# Patient Record
Sex: Male | Born: 1955 | Race: Black or African American | Hispanic: No | Marital: Single | Smoking: Former smoker
Health system: Southern US, Community
[De-identification: ages and names within clinical notes are randomized; demographics above are authoritative.]

## PROBLEM LIST (undated history)

## (undated) DIAGNOSIS — Z800 Family history of malignant neoplasm of digestive organs: Secondary | ICD-10-CM

## (undated) DIAGNOSIS — Z5189 Encounter for other specified aftercare: Secondary | ICD-10-CM

## (undated) DIAGNOSIS — Z860100 Personal history of colon polyps, unspecified: Secondary | ICD-10-CM

## (undated) DIAGNOSIS — F329 Major depressive disorder, single episode, unspecified: Secondary | ICD-10-CM

## (undated) DIAGNOSIS — F1910 Other psychoactive substance abuse, uncomplicated: Secondary | ICD-10-CM

## (undated) DIAGNOSIS — L853 Xerosis cutis: Secondary | ICD-10-CM

## (undated) DIAGNOSIS — R739 Hyperglycemia, unspecified: Secondary | ICD-10-CM

## (undated) DIAGNOSIS — B20 Human immunodeficiency virus [HIV] disease: Secondary | ICD-10-CM

## (undated) DIAGNOSIS — F32A Depression, unspecified: Secondary | ICD-10-CM

## (undated) DIAGNOSIS — Z21 Asymptomatic human immunodeficiency virus [HIV] infection status: Secondary | ICD-10-CM

## (undated) DIAGNOSIS — R636 Underweight: Secondary | ICD-10-CM

## (undated) HISTORY — DX: Other psychoactive substance abuse, uncomplicated: F19.10

## (undated) HISTORY — DX: Depression, unspecified: F32.A

## (undated) HISTORY — PX: POLYPECTOMY: SHX149

## (undated) HISTORY — DX: Personal history of colon polyps, unspecified: Z86.0100

## (undated) HISTORY — PX: COLONOSCOPY: SHX174

## (undated) HISTORY — PX: TRUNK WOUND REPAIR / CLOSURE: SUR1144

## (undated) HISTORY — DX: Personal history of colonic polyps: Z86.010

## (undated) HISTORY — DX: Human immunodeficiency virus (HIV) disease: B20

## (undated) HISTORY — DX: Encounter for other specified aftercare: Z51.89

## (undated) HISTORY — DX: Asymptomatic human immunodeficiency virus (hiv) infection status: Z21

## (undated) HISTORY — DX: Family history of malignant neoplasm of digestive organs: Z80.0

---
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
## 1978-08-05 DIAGNOSIS — Z5189 Encounter for other specified aftercare: Secondary | ICD-10-CM

## 1978-08-05 HISTORY — DX: Encounter for other specified aftercare: Z51.89

## 1997-11-22 ENCOUNTER — Encounter: Admission: RE | Admit: 1997-11-22 | Discharge: 1997-11-22 | Payer: Self-pay | Admitting: Hematology and Oncology

## 1998-01-23 ENCOUNTER — Encounter: Admission: RE | Admit: 1998-01-23 | Discharge: 1998-01-23 | Payer: Self-pay | Admitting: Internal Medicine

## 1998-02-23 ENCOUNTER — Encounter: Admission: RE | Admit: 1998-02-23 | Discharge: 1998-02-23 | Payer: Self-pay | Admitting: Hematology and Oncology

## 1998-03-30 ENCOUNTER — Encounter: Admission: RE | Admit: 1998-03-30 | Discharge: 1998-03-30 | Payer: Self-pay | Admitting: Internal Medicine

## 1998-04-06 ENCOUNTER — Encounter: Admission: RE | Admit: 1998-04-06 | Discharge: 1998-04-06 | Payer: Self-pay | Admitting: Internal Medicine

## 1998-07-13 ENCOUNTER — Encounter: Admission: RE | Admit: 1998-07-13 | Discharge: 1998-07-13 | Payer: Self-pay | Admitting: Hematology and Oncology

## 1999-01-11 ENCOUNTER — Encounter: Admission: RE | Admit: 1999-01-11 | Discharge: 1999-01-11 | Payer: Self-pay | Admitting: Hematology and Oncology

## 1999-01-11 ENCOUNTER — Ambulatory Visit (HOSPITAL_COMMUNITY): Admission: RE | Admit: 1999-01-11 | Discharge: 1999-01-11 | Payer: Self-pay | Admitting: Hematology and Oncology

## 1999-03-12 ENCOUNTER — Encounter: Admission: RE | Admit: 1999-03-12 | Discharge: 1999-03-12 | Payer: Self-pay | Admitting: Internal Medicine

## 1999-06-11 ENCOUNTER — Encounter: Admission: RE | Admit: 1999-06-11 | Discharge: 1999-06-11 | Payer: Self-pay | Admitting: Hematology and Oncology

## 1999-06-11 ENCOUNTER — Ambulatory Visit (HOSPITAL_COMMUNITY): Admission: RE | Admit: 1999-06-11 | Discharge: 1999-06-11 | Payer: Self-pay | Admitting: Hematology and Oncology

## 1999-11-26 ENCOUNTER — Encounter: Admission: RE | Admit: 1999-11-26 | Discharge: 1999-11-26 | Payer: Self-pay | Admitting: Internal Medicine

## 1999-11-26 ENCOUNTER — Ambulatory Visit (HOSPITAL_COMMUNITY): Admission: RE | Admit: 1999-11-26 | Discharge: 1999-11-26 | Payer: Self-pay | Admitting: Internal Medicine

## 2000-06-02 ENCOUNTER — Ambulatory Visit (HOSPITAL_COMMUNITY): Admission: RE | Admit: 2000-06-02 | Discharge: 2000-06-02 | Payer: Self-pay

## 2000-06-02 ENCOUNTER — Encounter: Admission: RE | Admit: 2000-06-02 | Discharge: 2000-06-02 | Payer: Self-pay

## 2001-03-27 ENCOUNTER — Encounter: Admission: RE | Admit: 2001-03-27 | Discharge: 2001-03-27 | Payer: Self-pay | Admitting: Internal Medicine

## 2001-03-31 ENCOUNTER — Encounter: Admission: RE | Admit: 2001-03-31 | Discharge: 2001-03-31 | Payer: Self-pay | Admitting: Internal Medicine

## 2001-03-31 ENCOUNTER — Ambulatory Visit (HOSPITAL_COMMUNITY): Admission: RE | Admit: 2001-03-31 | Discharge: 2001-03-31 | Payer: Self-pay | Admitting: Internal Medicine

## 2001-08-12 ENCOUNTER — Encounter: Admission: RE | Admit: 2001-08-12 | Discharge: 2001-08-12 | Payer: Self-pay

## 2001-08-20 ENCOUNTER — Encounter: Admission: RE | Admit: 2001-08-20 | Discharge: 2001-08-20 | Payer: Self-pay

## 2001-08-20 ENCOUNTER — Ambulatory Visit (HOSPITAL_COMMUNITY): Admission: RE | Admit: 2001-08-20 | Discharge: 2001-08-20 | Payer: Self-pay

## 2001-11-26 ENCOUNTER — Encounter: Admission: RE | Admit: 2001-11-26 | Discharge: 2001-11-26 | Payer: Self-pay | Admitting: Internal Medicine

## 2001-12-07 ENCOUNTER — Encounter (INDEPENDENT_AMBULATORY_CARE_PROVIDER_SITE_OTHER): Payer: Self-pay | Admitting: Specialist

## 2001-12-07 ENCOUNTER — Ambulatory Visit (HOSPITAL_COMMUNITY): Admission: RE | Admit: 2001-12-07 | Discharge: 2001-12-07 | Payer: Self-pay | Admitting: Gastroenterology

## 2002-03-01 ENCOUNTER — Encounter: Admission: RE | Admit: 2002-03-01 | Discharge: 2002-03-01 | Payer: Self-pay | Admitting: Internal Medicine

## 2002-03-01 ENCOUNTER — Ambulatory Visit (HOSPITAL_COMMUNITY): Admission: RE | Admit: 2002-03-01 | Discharge: 2002-03-01 | Payer: Self-pay | Admitting: Internal Medicine

## 2002-05-31 ENCOUNTER — Encounter: Admission: RE | Admit: 2002-05-31 | Discharge: 2002-05-31 | Payer: Self-pay | Admitting: Internal Medicine

## 2002-05-31 ENCOUNTER — Ambulatory Visit (HOSPITAL_COMMUNITY): Admission: RE | Admit: 2002-05-31 | Discharge: 2002-05-31 | Payer: Self-pay | Admitting: Internal Medicine

## 2002-06-04 ENCOUNTER — Encounter: Payer: Self-pay | Admitting: *Deleted

## 2002-06-04 ENCOUNTER — Ambulatory Visit (HOSPITAL_COMMUNITY): Admission: RE | Admit: 2002-06-04 | Discharge: 2002-06-04 | Payer: Self-pay | Admitting: *Deleted

## 2002-07-06 ENCOUNTER — Ambulatory Visit (HOSPITAL_COMMUNITY): Admission: RE | Admit: 2002-07-06 | Discharge: 2002-07-06 | Payer: Self-pay | Admitting: Internal Medicine

## 2002-07-06 ENCOUNTER — Encounter: Admission: RE | Admit: 2002-07-06 | Discharge: 2002-07-06 | Payer: Self-pay | Admitting: Internal Medicine

## 2002-09-28 ENCOUNTER — Encounter: Admission: RE | Admit: 2002-09-28 | Discharge: 2002-09-28 | Payer: Self-pay | Admitting: Internal Medicine

## 2003-03-29 ENCOUNTER — Encounter: Payer: Self-pay | Admitting: Internal Medicine

## 2003-03-29 ENCOUNTER — Encounter: Admission: RE | Admit: 2003-03-29 | Discharge: 2003-03-29 | Payer: Self-pay | Admitting: Internal Medicine

## 2003-03-29 ENCOUNTER — Ambulatory Visit (HOSPITAL_COMMUNITY): Admission: RE | Admit: 2003-03-29 | Discharge: 2003-03-29 | Payer: Self-pay | Admitting: Internal Medicine

## 2003-06-02 ENCOUNTER — Encounter: Admission: RE | Admit: 2003-06-02 | Discharge: 2003-06-02 | Payer: Self-pay | Admitting: Internal Medicine

## 2003-09-30 ENCOUNTER — Encounter: Admission: RE | Admit: 2003-09-30 | Discharge: 2003-09-30 | Payer: Self-pay | Admitting: Internal Medicine

## 2003-10-03 ENCOUNTER — Ambulatory Visit (HOSPITAL_COMMUNITY): Admission: RE | Admit: 2003-10-03 | Discharge: 2003-10-03 | Payer: Self-pay | Admitting: Internal Medicine

## 2003-10-03 ENCOUNTER — Encounter: Admission: RE | Admit: 2003-10-03 | Discharge: 2003-10-03 | Payer: Self-pay | Admitting: Infectious Diseases

## 2004-02-16 ENCOUNTER — Encounter: Admission: RE | Admit: 2004-02-16 | Discharge: 2004-02-16 | Payer: Self-pay | Admitting: Internal Medicine

## 2005-05-02 ENCOUNTER — Ambulatory Visit: Payer: Self-pay | Admitting: Internal Medicine

## 2005-05-02 ENCOUNTER — Ambulatory Visit (HOSPITAL_COMMUNITY): Admission: RE | Admit: 2005-05-02 | Discharge: 2005-05-02 | Payer: Self-pay | Admitting: Internal Medicine

## 2005-10-11 ENCOUNTER — Ambulatory Visit: Payer: Self-pay | Admitting: Internal Medicine

## 2005-10-15 ENCOUNTER — Ambulatory Visit: Payer: Self-pay | Admitting: Hospitalist

## 2005-10-15 ENCOUNTER — Encounter: Admission: RE | Admit: 2005-10-15 | Discharge: 2005-10-15 | Payer: Self-pay | Admitting: Hospitalist

## 2006-04-22 ENCOUNTER — Ambulatory Visit: Payer: Self-pay | Admitting: Hospitalist

## 2006-04-22 ENCOUNTER — Encounter: Admission: RE | Admit: 2006-04-22 | Discharge: 2006-04-22 | Payer: Self-pay | Admitting: Hospitalist

## 2006-06-17 DIAGNOSIS — F1021 Alcohol dependence, in remission: Secondary | ICD-10-CM

## 2006-06-17 DIAGNOSIS — B20 Human immunodeficiency virus [HIV] disease: Secondary | ICD-10-CM

## 2006-06-17 DIAGNOSIS — A6000 Herpesviral infection of urogenital system, unspecified: Secondary | ICD-10-CM | POA: Insufficient documentation

## 2006-06-17 DIAGNOSIS — Z8619 Personal history of other infectious and parasitic diseases: Secondary | ICD-10-CM

## 2006-06-17 DIAGNOSIS — F528 Other sexual dysfunction not due to a substance or known physiological condition: Secondary | ICD-10-CM

## 2006-06-17 DIAGNOSIS — J309 Allergic rhinitis, unspecified: Secondary | ICD-10-CM | POA: Insufficient documentation

## 2006-06-17 DIAGNOSIS — G609 Hereditary and idiopathic neuropathy, unspecified: Secondary | ICD-10-CM | POA: Insufficient documentation

## 2006-06-17 DIAGNOSIS — B1920 Unspecified viral hepatitis C without hepatic coma: Secondary | ICD-10-CM | POA: Insufficient documentation

## 2006-06-17 DIAGNOSIS — F1210 Cannabis abuse, uncomplicated: Secondary | ICD-10-CM

## 2006-06-17 HISTORY — DX: Other sexual dysfunction not due to a substance or known physiological condition: F52.8

## 2006-06-17 HISTORY — DX: Alcohol dependence, in remission: F10.21

## 2006-09-29 ENCOUNTER — Ambulatory Visit: Payer: Self-pay | Admitting: Hospitalist

## 2006-09-30 ENCOUNTER — Telehealth (INDEPENDENT_AMBULATORY_CARE_PROVIDER_SITE_OTHER): Payer: Self-pay | Admitting: Dermatology

## 2006-10-17 ENCOUNTER — Telehealth (INDEPENDENT_AMBULATORY_CARE_PROVIDER_SITE_OTHER): Payer: Self-pay | Admitting: *Deleted

## 2006-11-13 ENCOUNTER — Telehealth: Payer: Self-pay | Admitting: *Deleted

## 2006-11-26 ENCOUNTER — Encounter (INDEPENDENT_AMBULATORY_CARE_PROVIDER_SITE_OTHER): Payer: Self-pay | Admitting: *Deleted

## 2006-11-26 ENCOUNTER — Encounter: Admission: RE | Admit: 2006-11-26 | Discharge: 2006-11-26 | Payer: Self-pay | Admitting: Hospitalist

## 2006-11-26 ENCOUNTER — Ambulatory Visit: Payer: Self-pay | Admitting: Hospitalist

## 2006-11-26 LAB — CONVERTED CEMR LAB
ALT: 28 units/L (ref 0–53)
AST: 38 units/L — ABNORMAL HIGH (ref 0–37)
Albumin: 3.8 g/dL (ref 3.5–5.2)
Alkaline Phosphatase: 89 units/L (ref 39–117)
BUN: 6 mg/dL (ref 6–23)
CD4 Count: 300 microliters
CO2: 22 meq/L (ref 19–32)
Calcium: 8.5 mg/dL (ref 8.4–10.5)
Chloride: 107 meq/L (ref 96–112)
Creatinine, Ser: 0.59 mg/dL (ref 0.40–1.50)
Glucose, Bld: 95 mg/dL (ref 70–99)
HCT: 43.2 % (ref 39.0–52.0)
HIV 1 RNA Quant: <50 copies/mL (ref <50)
HIV-1 RNA Quant, Log: <1.7 (ref <1.70)
Hemoglobin: 13.8 g/dL (ref 13.0–17.0)
MCHC: 31.9 g/dL (ref 30.0–36.0)
MCV: 104.9 fL — ABNORMAL HIGH (ref 78.0–100.0)
Platelets: 177 10*3/uL (ref 150–400)
Potassium: 4.2 meq/L (ref 3.5–5.3)
RBC: 4.12 M/uL — ABNORMAL LOW (ref 4.22–5.81)
RDW: 14.7 % — ABNORMAL HIGH (ref 11.5–14.0)
Sodium: 142 meq/L (ref 135–145)
Total Bilirubin: 0.3 mg/dL (ref 0.3–1.2)
Total Protein: 7.8 g/dL (ref 6.0–8.3)
WBC: 2.9 10*3/uL — ABNORMAL LOW (ref 4.0–10.5)

## 2006-11-28 ENCOUNTER — Telehealth: Payer: Self-pay | Admitting: *Deleted

## 2006-12-03 ENCOUNTER — Encounter: Payer: Self-pay | Admitting: Licensed Clinical Social Worker

## 2006-12-15 ENCOUNTER — Telehealth: Payer: Self-pay | Admitting: *Deleted

## 2007-01-05 ENCOUNTER — Ambulatory Visit: Payer: Self-pay | Admitting: Internal Medicine

## 2007-01-05 ENCOUNTER — Encounter (INDEPENDENT_AMBULATORY_CARE_PROVIDER_SITE_OTHER): Payer: Self-pay | Admitting: Dermatology

## 2007-01-05 DIAGNOSIS — IMO0002 Reserved for concepts with insufficient information to code with codable children: Secondary | ICD-10-CM

## 2007-02-13 ENCOUNTER — Telehealth: Payer: Self-pay | Admitting: *Deleted

## 2007-03-13 ENCOUNTER — Telehealth: Payer: Self-pay | Admitting: Internal Medicine

## 2007-08-11 ENCOUNTER — Telehealth: Payer: Self-pay | Admitting: Infectious Disease

## 2007-08-14 ENCOUNTER — Encounter (INDEPENDENT_AMBULATORY_CARE_PROVIDER_SITE_OTHER): Payer: Self-pay | Admitting: *Deleted

## 2007-08-14 ENCOUNTER — Encounter: Admission: RE | Admit: 2007-08-14 | Discharge: 2007-08-14 | Payer: Self-pay | Admitting: Internal Medicine

## 2007-08-14 ENCOUNTER — Ambulatory Visit: Payer: Self-pay | Admitting: Internal Medicine

## 2007-08-14 LAB — CONVERTED CEMR LAB
AST: 57 units/L — ABNORMAL HIGH (ref 0–37)
Albumin: 3.9 g/dL (ref 3.5–5.2)
Alkaline Phosphatase: 80 units/L (ref 39–117)
BUN: 13 mg/dL (ref 6–23)
Basophils Absolute: 0 10*3/uL (ref 0.0–0.1)
CO2: 23 meq/L (ref 19–32)
Chloride: 108 meq/L (ref 96–112)
Creatinine, Ser: 0.64 mg/dL (ref 0.40–1.50)
Eosinophils Relative: 2 % (ref 0–5)
HCT: 42.1 % (ref 39.0–52.0)
Hemoglobin: 14 g/dL (ref 13.0–17.0)
Lymphocytes Relative: 55 % — ABNORMAL HIGH (ref 12–46)
MCHC: 33.3 g/dL (ref 30.0–36.0)
Monocytes Absolute: 0.3 10*3/uL (ref 0.1–1.0)
Monocytes Relative: 11 % (ref 3–12)
Neutro Abs: 0.9 10*3/uL — ABNORMAL LOW (ref 1.7–7.7)
RBC: 4.22 M/uL (ref 4.22–5.81)
RDW: 14.6 % (ref 11.5–15.5)
Total Protein: 7.9 g/dL (ref 6.0–8.3)

## 2007-11-04 ENCOUNTER — Telehealth (INDEPENDENT_AMBULATORY_CARE_PROVIDER_SITE_OTHER): Payer: Self-pay | Admitting: *Deleted

## 2007-11-05 ENCOUNTER — Encounter (INDEPENDENT_AMBULATORY_CARE_PROVIDER_SITE_OTHER): Payer: Self-pay | Admitting: *Deleted

## 2007-11-12 ENCOUNTER — Encounter: Payer: Self-pay | Admitting: Licensed Clinical Social Worker

## 2007-12-05 ENCOUNTER — Emergency Department (HOSPITAL_COMMUNITY): Admission: EM | Admit: 2007-12-05 | Discharge: 2007-12-05 | Payer: Self-pay | Admitting: Emergency Medicine

## 2007-12-10 ENCOUNTER — Inpatient Hospital Stay (HOSPITAL_COMMUNITY): Admission: EM | Admit: 2007-12-10 | Discharge: 2007-12-12 | Payer: Self-pay | Admitting: Emergency Medicine

## 2007-12-10 ENCOUNTER — Ambulatory Visit: Payer: Self-pay | Admitting: Internal Medicine

## 2007-12-12 ENCOUNTER — Encounter (INDEPENDENT_AMBULATORY_CARE_PROVIDER_SITE_OTHER): Payer: Self-pay | Admitting: Internal Medicine

## 2007-12-14 ENCOUNTER — Encounter (INDEPENDENT_AMBULATORY_CARE_PROVIDER_SITE_OTHER): Payer: Self-pay | Admitting: Internal Medicine

## 2007-12-14 ENCOUNTER — Ambulatory Visit: Payer: Self-pay | Admitting: Internal Medicine

## 2007-12-14 ENCOUNTER — Telehealth (INDEPENDENT_AMBULATORY_CARE_PROVIDER_SITE_OTHER): Payer: Self-pay | Admitting: *Deleted

## 2007-12-18 ENCOUNTER — Telehealth (INDEPENDENT_AMBULATORY_CARE_PROVIDER_SITE_OTHER): Payer: Self-pay | Admitting: *Deleted

## 2007-12-19 LAB — CONVERTED CEMR LAB
HCT: 38.6 % — ABNORMAL LOW (ref 39.0–52.0)
MCHC: 32.8 g/dL (ref 30.0–36.0)
MCV: 103.8 fL — ABNORMAL HIGH (ref 78.0–100.0)
Platelets: 187 10*3/uL (ref 150–400)
RDW: 13.9 % (ref 11.5–15.5)

## 2007-12-23 ENCOUNTER — Ambulatory Visit: Payer: Self-pay | Admitting: Cardiovascular Disease

## 2008-01-05 ENCOUNTER — Encounter: Payer: Self-pay | Admitting: Cardiovascular Disease

## 2008-01-05 ENCOUNTER — Ambulatory Visit: Payer: Self-pay

## 2008-01-18 ENCOUNTER — Encounter: Admission: RE | Admit: 2008-01-18 | Discharge: 2008-01-18 | Payer: Self-pay | Admitting: *Deleted

## 2008-01-18 ENCOUNTER — Ambulatory Visit: Payer: Self-pay | Admitting: *Deleted

## 2008-01-18 DIAGNOSIS — E785 Hyperlipidemia, unspecified: Secondary | ICD-10-CM | POA: Insufficient documentation

## 2008-01-18 DIAGNOSIS — K219 Gastro-esophageal reflux disease without esophagitis: Secondary | ICD-10-CM

## 2008-03-08 ENCOUNTER — Telehealth: Payer: Self-pay | Admitting: *Deleted

## 2008-05-10 ENCOUNTER — Encounter: Payer: Self-pay | Admitting: Internal Medicine

## 2008-05-17 ENCOUNTER — Ambulatory Visit: Payer: Self-pay | Admitting: Infectious Disease

## 2008-05-17 ENCOUNTER — Encounter: Payer: Self-pay | Admitting: Internal Medicine

## 2008-06-17 ENCOUNTER — Ambulatory Visit: Payer: Self-pay | Admitting: *Deleted

## 2008-06-17 DIAGNOSIS — D126 Benign neoplasm of colon, unspecified: Secondary | ICD-10-CM

## 2008-06-22 ENCOUNTER — Ambulatory Visit: Payer: Self-pay | Admitting: *Deleted

## 2008-06-22 ENCOUNTER — Encounter: Payer: Self-pay | Admitting: Internal Medicine

## 2008-06-22 LAB — CONVERTED CEMR LAB
Albumin: 3.8 g/dL (ref 3.5–5.2)
Alkaline Phosphatase: 78 units/L (ref 39–117)
BUN: 12 mg/dL (ref 6–23)
Cholesterol: 182 mg/dL (ref 0–200)
Eosinophils Absolute: 0.1 10*3/uL (ref 0.0–0.7)
Eosinophils Relative: 2 % (ref 0–5)
Glucose, Bld: 108 mg/dL — ABNORMAL HIGH (ref 70–99)
HCT: 40 % (ref 39.0–52.0)
HDL: 43 mg/dL (ref >39)
LDL Cholesterol: 94 mg/dL (ref 0–99)
Lymphs Abs: 2.3 10*3/uL (ref 0.7–4.0)
MCV: 100.3 fL — ABNORMAL HIGH (ref 78.0–100.0)
Monocytes Relative: 13 % — ABNORMAL HIGH (ref 3–12)
Potassium: 3.8 meq/L (ref 3.5–5.3)
RBC: 3.99 M/uL — ABNORMAL LOW (ref 4.22–5.81)
Triglycerides: 224 mg/dL — ABNORMAL HIGH (ref <150)
WBC: 4 10*3/uL (ref 4.0–10.5)

## 2009-01-23 ENCOUNTER — Telehealth: Payer: Self-pay | Admitting: Internal Medicine

## 2009-02-14 ENCOUNTER — Ambulatory Visit: Payer: Self-pay | Admitting: Internal Medicine

## 2009-02-14 ENCOUNTER — Encounter: Payer: Self-pay | Admitting: Internal Medicine

## 2009-02-14 DIAGNOSIS — Z720 Tobacco use: Secondary | ICD-10-CM

## 2009-02-14 LAB — CONVERTED CEMR LAB
ALT: 38 units/L (ref 0–53)
BUN: 12 mg/dL (ref 6–23)
CO2: 25 meq/L (ref 19–32)
Chlamydia, Swab/Urine, PCR: NEGATIVE
Cholesterol: 185 mg/dL (ref 0–200)
Creatinine, Ser: 0.68 mg/dL (ref 0.40–1.50)
GC Probe Amp, Urine: NEGATIVE
Glucose, Bld: 97 mg/dL (ref 70–99)
HDL: 52 mg/dL (ref >39)
Hemoglobin: 13.5 g/dL (ref 13.0–17.0)
LDL Cholesterol: 102 mg/dL — ABNORMAL HIGH (ref 0–99)
RBC: 4.11 M/uL — ABNORMAL LOW (ref 4.22–5.81)
Total Bilirubin: 0.3 mg/dL (ref 0.3–1.2)
Total CHOL/HDL Ratio: 3.6
Triglycerides: 155 mg/dL — ABNORMAL HIGH (ref <150)
VLDL: 31 mg/dL (ref 0–40)
WBC: 3.3 10*3/uL — ABNORMAL LOW (ref 4.0–10.5)

## 2009-02-21 ENCOUNTER — Ambulatory Visit: Payer: Self-pay | Admitting: Internal Medicine

## 2009-02-21 LAB — CONVERTED CEMR LAB: OCCULT 2: NEGATIVE

## 2009-03-01 ENCOUNTER — Ambulatory Visit: Payer: Self-pay | Admitting: Internal Medicine

## 2009-03-20 ENCOUNTER — Telehealth: Payer: Self-pay | Admitting: Internal Medicine

## 2009-06-26 ENCOUNTER — Telehealth: Payer: Self-pay | Admitting: Internal Medicine

## 2010-01-17 ENCOUNTER — Ambulatory Visit: Payer: Self-pay | Admitting: Internal Medicine

## 2010-01-17 ENCOUNTER — Encounter: Payer: Self-pay | Admitting: Internal Medicine

## 2010-01-17 LAB — CONVERTED CEMR LAB
ALT: 33 units/L (ref 0–53)
AST: 77 units/L — ABNORMAL HIGH (ref 0–37)
Albumin: 3.9 g/dL (ref 3.5–5.2)
BUN: 9 mg/dL (ref 6–23)
CO2: 27 meq/L (ref 19–32)
Calcium: 9.3 mg/dL (ref 8.4–10.5)
Chloride: 100 meq/L (ref 96–112)
Cholesterol: 204 mg/dL — ABNORMAL HIGH (ref 0–200)
Creatinine, Ser: 0.64 mg/dL (ref 0.40–1.50)
Eosinophils Absolute: 0.1 10*3/uL (ref 0.0–0.7)
Eosinophils Relative: 2 % (ref 0–5)
HCT: 42.6 % (ref 39.0–52.0)
HIV 1 RNA Quant: <48 copies/mL (ref <48)
HIV-1 RNA Quant, Log: <1.68 (ref <1.68)
Lymphocytes Relative: 55 % — ABNORMAL HIGH (ref 12–46)
Lymphs Abs: 2.2 10*3/uL (ref 0.7–4.0)
MCV: 102.9 fL — ABNORMAL HIGH (ref 78.0–100.0)
Monocytes Relative: 10 % (ref 3–12)
Potassium: 5.2 meq/L (ref 3.5–5.3)
RBC: 4.14 M/uL — ABNORMAL LOW (ref 4.22–5.81)
Total CHOL/HDL Ratio: 4.7
WBC: 4 10*3/uL (ref 4.0–10.5)

## 2010-01-22 ENCOUNTER — Telehealth: Payer: Self-pay | Admitting: Internal Medicine

## 2010-02-01 ENCOUNTER — Ambulatory Visit: Payer: Self-pay | Admitting: Internal Medicine

## 2010-02-26 ENCOUNTER — Ambulatory Visit: Payer: Self-pay | Admitting: Internal Medicine

## 2010-04-26 ENCOUNTER — Encounter (INDEPENDENT_AMBULATORY_CARE_PROVIDER_SITE_OTHER): Payer: Self-pay | Admitting: *Deleted

## 2010-08-29 ENCOUNTER — Encounter (INDEPENDENT_AMBULATORY_CARE_PROVIDER_SITE_OTHER): Payer: Self-pay | Admitting: *Deleted

## 2010-09-06 NOTE — Miscellaneous (Signed)
  Clinical Lists Changes  Observations: Added new observation of INCOMESOURCE: UNKNOWN (08/29/2010 13:02) Added new observation of HOUSEINCOME: 0  (08/29/2010 13:02) Added new observation of #CHILD<18 IN: Unknown  (08/29/2010 13:02) Added new observation of FAMILYSIZE: 0  (08/29/2010 13:02) Added new observation of HOUSING: Unknown  (08/29/2010 13:02) Added new observation of YEARLYEXPEN: 0  (08/29/2010 13:02) Added new observation of GENDER: Male  (08/29/2010 13:02) Added new observation of MARITAL STAT: Unknown  (08/29/2010 13:02) Added new observation of LATINO/HISP: Unknown  (08/29/2010 13:02) Added new observation of RACE: Unknown  (08/29/2010 13:02) Added new observation of PATNTCOUNTY: [place]  (08/29/2010 13:02)

## 2010-09-06 NOTE — Assessment & Plan Note (Signed)
Summary: EST-CK/FU/MEDS/CFB   Vital Signs:  Patient profile:   55 year old male Height:      66 inches (167.64 cm) Weight:      111.5 pounds (50.68 kg) BMI:     18.06 Temp:     97.6 degrees F Pulse rate:   67 / minute BP sitting:   126 / 74  (left arm) Cuff size:   small  Vitals Entered By: Dorie Rank RN (February 26, 2010 4:24 PM) CC: check up - c/o "feeling like vomiting in throat on and off over in past, but not this week" Is Patient Diabetic? No Pain Assessment Patient in pain? no      Nutritional Status BMI of < 19 = underweight  Does patient need assistance? Functional Status Self care Ambulation Normal   Primary Care Provider:  Blondell Reveal MD  CC:  check up - c/o "feeling like vomiting in throat on and off over in past and but not this week".  History of Present Illness: 55 yr old AAM with PMH as mentioned in the EMR comes to the office for a regular office visit.  1. Patient reports that he has having reflux of his acid to his chest and throat.  He denies trying any OTC medicines for this. His symptoms come occasionally and reports that he has some nausea and vomiting. He reports that he vomited about twice so far.   2. He reports compliancy to his HIV medicines.  3. He hasn't gone back to smoking .  Denies any other complaints.   Preventive Screening-Counseling & Management  Alcohol-Tobacco     Alcohol drinks/day: 0     Smoking Status: quit     Smoking Cessation Counseling: yes     Packs/Day: 1     Year Started: approx 1970's     Year Quit: 2009  Caffeine-Diet-Exercise     Caffeine use/day: occasional     Does Patient Exercise: yes     Type of exercise: yard work     Exercise (avg: min/session): 30-60     Times/week: <3  Problems Prior to Update: 1)  Preventive Health Care  (ICD-V70.0) 2)  Colonoscopy With Biopsy, Hx of  (ICD-V15.89) 3)  Gerd  (ICD-530.81) 4)  Dyslipidemia  (ICD-272.4) 5)  Sprain/strain, Shoulder/arm Nos  (ICD-840.9) 6)   Alcohol Abuse, Hx of  (ICD-V11.3) 7)  Genital Herpes  (ICD-054.10) 8)  Peripheral Neuropathy  (ICD-356.9) 9)  Erectile Dysfunction  (ICD-302.72) 10)  Cannabis Abuse  (ICD-305.20) 11)  Hx of Tobacco Abuse  (ICD-305.1) 12)  HIV Disease  (ICD-042) 13)  Hepatitis C  (ICD-070.51) 14)  Hepatitis B, Hx of  (ICD-V12.09) 15)  Allergic Rhinitis  (ICD-477.9)  Medications Prior to Update: 1)  Combivir 150-300 Mg Tabs (Lamivudine-Zidovudine) .... Take One Tablet By Mouth Two Times A Day. 2)  Viagra 100 Mg Tabs (Sildenafil Citrate) .... Take One Tablet By Mouth As Needed.  Do Not Exceed One Tablet Daily. 3)  Sustiva 600 Mg  Tabs (Efavirenz) .... Take 1 Tablet By Mouth At Bedtime. 4)  Ensure  Liqd (Nutritional Supplements) .... One Can Two Times A Day  Current Medications (verified): 1)  Combivir 150-300 Mg Tabs (Lamivudine-Zidovudine) .... Take One Tablet By Mouth Two Times A Day. 2)  Viagra 100 Mg Tabs (Sildenafil Citrate) .... Take One Tablet By Mouth As Needed.  Do Not Exceed One Tablet Daily. 3)  Sustiva 600 Mg  Tabs (Efavirenz) .... Take 1 Tablet By Mouth At Bedtime. 4)  Ensure  Liqd (Nutritional Supplements) .... One Can Two Times A Day  Allergies (verified): No Known Drug Allergies  Past History:  Family History: Last updated: 21-Aug-2007 Mom: deceased (24), had DM Dad: decreased, never really knew him, was always DM Brothers/sisters:  9 total siblings (2 deceased as of 08-20-2022) Children: 5, healthy  Social History: Last updated: 06/17/2008 Disabled, thinking about going back to school Smokes 1 ppd x 39y, on smoking cesation with nIcotine patches from 10/13. Drinks only rarely (1-2x/year) Smokes marijuana daily  Risk Factors: Alcohol Use: 0 (02/26/2010) Caffeine Use: occasional (02/26/2010) Exercise: yes (02/26/2010)  Risk Factors: Smoking Status: quit (02/26/2010) Packs/Day: 1 (02/26/2010)  Past Medical History: Reviewed history from 06/17/2008 and no changes  required. Single episode of CP/abd pain, admitted 5/09 - r/o AMI and PE   - Stress echo 5/09 normal Rectal and Sigmoid Polyps s/p polypectomy 5/03 PCP Genital Herpes Allergic rhinitis Hepatitis C HIV disease  Past Surgical History: Reviewed history from 05/17/2008 and no changes required. Rectal and Sigmoid Polyps s/p polypectomy 5/03  Family History: Reviewed history from 2007-08-21 and no changes required. Mom: deceased (38), had DM Dad: decreased, never really knew him, was always DM Brothers/sisters:  9 total siblings (2 deceased as of 2022-08-20) Children: 5, healthy  Social History: Reviewed history from 06/17/2008 and no changes required. Disabled, thinking about going back to school Smokes 1 ppd x 39y, on smoking cesation with nIcotine patches from 10/13. Drinks only rarely (1-2x/year) Smokes marijuana daily  Review of Systems      See HPI  Physical Exam  General:  alert, well-developed, and well-nourished.   Head:  normocephalic and atraumatic.   Neck:  supple, full ROM, and no masses.   Chest Wall:  no deformities, no tenderness, and no masses.   Lungs:  normal respiratory effort, no intercostal retractions, no accessory muscle use, and normal breath sounds.   Heart:  normal rate, regular rhythm, no murmur, and no gallop.   Abdomen:  soft, non-tender, normal bowel sounds, and no distention.   Pulses:  R radial normal.   Extremities:  No clubbing, cyanosis, edema, or deformity noted with normal full range of motion of all joints.   Neurologic:  alert & oriented X3, cranial nerves II-XII intact, strength normal in all extremities, sensation intact to light touch, and gait normal.     Impression & Recommendations:  Problem # 1:  GERD (ICD-530.81)  Will start him on PPI. Recommended him to take only as needed basis.  His updated medication list for this problem includes:    Omeprazole 40 Mg Cpdr (Omeprazole) .Marland Kitchen... Take 1 tablet by mouth once a day  Problem # 2:   Hx of TOBACCO ABUSE (ICD-305.1) Stopped smoking and has never gone back again.  Problem # 3:  HIV DISEASE (ICD-042) Reports compliancy to his HIV medicines.  Problem # 4:  ERECTILE DYSFUNCTION (ICD-302.72) Reports that medicines works "the best".   His updated medication list for this problem includes:    Viagra 100 Mg Tabs (Sildenafil citrate) .Marland Kitchen... Take one tablet by mouth as needed.  do not exceed one tablet daily.  Complete Medication List: 1)  Combivir 150-300 Mg Tabs (Lamivudine-zidovudine) .... Take one tablet by mouth two times a day. 2)  Viagra 100 Mg Tabs (Sildenafil citrate) .... Take one tablet by mouth as needed.  do not exceed one tablet daily. 3)  Sustiva 600 Mg Tabs (Efavirenz) .... Take 1 tablet by mouth at bedtime. 4)  Ensure Liqd (Nutritional supplements) .... One  can two times a day 5)  Omeprazole 40 Mg Cpdr (Omeprazole) .... Take 1 tablet by mouth once a day  Patient Instructions: 1)  Please schedule a follow-up appointment in 6 months. 2)  Take all the medications as advised below. Prescriptions: OMEPRAZOLE 40 MG CPDR (OMEPRAZOLE) Take 1 tablet by mouth once a day  #30 x 3   Entered and Authorized by:   Blondell Reveal MD   Signed by:   Blondell Reveal MD on 02/26/2010   Method used:   Electronically to        [facility] [id number] (retail)       [address]       [place], [place]  [address]       Ph: [phone]       Fax: [phone]   RxID:   [id number]    Prevention & Chronic Care Immunizations   Influenza vaccine: Fluvax 3+  (05/17/2008)   Influenza vaccine deferral: Not indicated  (02/26/2010)    Tetanus booster: Not documented   Td booster deferral: Deferred  (02/26/2010)    Pneumococcal vaccine: Not documented  Colorectal Screening   Hemoccult: Not documented    Colonoscopy: Abnormal  (12/07/2001)  Other Screening   PSA: Not documented   Smoking status: quit  (02/26/2010)  Lipids   Total Cholesterol:  204  (01/17/2010)   LDL: 114  (01/17/2010)   LDL Direct: Not documented   HDL: 43  (01/17/2010)   Triglycerides: 235  (01/17/2010)    SGOT (AST): 77  (01/17/2010)   SGPT (ALT): 33  (01/17/2010)   Alkaline phosphatase: 86  (01/17/2010)   Total bilirubin: 0.3  (01/17/2010)  Self-Management Support :   Personal Goals (by the next clinic visit) :      Personal LDL goal: 100  (02/26/2010)    Patient will work on the following items until the next clinic visit to reach self-care goals:     Medications and monitoring: take my medicines every day  (02/26/2010)     Activity: take the stairs instead of the elevator  (02/26/2010)     Other: decreased appetite so does not watch what he eats - likes to push mow lawn  (02/26/2010)    Lipid self-management support: Education handout, Written self-care plan  (02/26/2010)   Lipid self-care plan printed.   Lipid education handout printed

## 2010-09-06 NOTE — Miscellaneous (Signed)
Summary: Orders Update  Clinical Lists Changes  Orders: Added new Test order of T-CBC w/Diff [phone]) - Signed Added new Test order of T-CD4SP [facility]) (CD4SP) - Signed Added new Test order of T-HIV Viral Load [phone]) - Signed Added new Test order of T-RPR (Syphilis) [phone]) - Signed Added new Test order of T-Comprehensive Metabolic Panel [phone]) - Signed Added new Test order of T-Lipid Profile ([phone]) - Signed

## 2010-09-06 NOTE — Assessment & Plan Note (Signed)
Summary: checkup [mkj]   Primary Provider:  Blondell Reveal MD  CC:  follow-up visit, lab results, and 2 weeks ago had an incident of chest pain.  History of Present Illness: Pt is here for f/u.  No new problems. He would like some viagra.  Preventive Screening-Counseling & Management  Alcohol-Tobacco     Alcohol drinks/day: 0     Smoking Status: quit     Smoking Cessation Counseling: yes     Packs/Day: 1     Year Started: approx 1970's     Year Quit: 2009  Caffeine-Diet-Exercise     Caffeine use/day: occasional     Does Patient Exercise: yes     Type of exercise: yard work     Exercise (avg: min/session): 30-60     Times/week: <3  Hep-HIV-STD-Contraception     HIV Risk: no  Safety-Violence-Falls     Seat Belt Use: yes      Sexual History:  dating.        Drug Use:  yes.    Comments: pt. given condoms   Updated Prior Medication List: COMBIVIR 150-300 MG TABS (LAMIVUDINE-ZIDOVUDINE) Take one tablet by mouth two times a day. VIAGRA 100 MG TABS (SILDENAFIL CITRATE) Take one tablet by mouth as needed.  Do not exceed one tablet daily. SUSTIVA 600 MG  TABS (EFAVIRENZ) Take 1 tablet by mouth at bedtime. ENSURE  LIQD (NUTRITIONAL SUPPLEMENTS) one can two times a day  Current Allergies (reviewed today): No known allergies  Past History:  Past Medical History: Last updated: 06/17/2008 Single episode of CP/abd pain, admitted 5/09 - r/o AMI and PE   - Stress echo 5/09 normal Rectal and Sigmoid Polyps s/p polypectomy 5/03 PCP Genital Herpes Allergic rhinitis Hepatitis C HIV disease  Social History: Sexual History:  dating  Review of Systems  The patient denies anorexia, fever, and weight loss.    Vital Signs:  Patient profile:   55 year old male Height:      66 inches (167.64 cm) Weight:      109.8 pounds (49.91 kg) BMI:     17.79 Temp:     97.9 degrees F (36.61 degrees C) oral Pulse rate:   57 / minute BP sitting:   116 / 68  (right arm)  Vitals  Entered By: Wendall Mola CMA Duncan Dull) (February 01, 2010 11:13 AM) CC: follow-up visit, lab results, 2 weeks ago had an incident of chest pain Is Patient Diabetic? No Pain Assessment Patient in pain? no      Nutritional Status BMI of < 19 = underweight Nutritional Status Detail appetite "up and down"  Does patient need assistance? Functional Status Self care Ambulation Normal Comments no missed doses of meds per patient   Physical Exam  General:  alert, well-developed, well-nourished, and well-hydrated.   Head:  normocephalic and atraumatic.   Mouth:  pharynx pink and moist.   Lungs:  normal breath sounds.      Impression & Recommendations:  Problem # 1:  HIV DISEASE (ICD-042) Pt.s most recent CD4ct was 360 and VL <48 .  Pt instructed to continue the current antiretroviral regimen.  Pt encouraged to take medication regularly and not miss doses.  Pt will f/u in 3 months for repeat blood work and will see me 2 weeks later.  Diagnostics Reviewed:  CD4: 360 (01/18/2010)   WBC: 4.0 (01/17/2010)   Hgb: 13.7 (01/17/2010)   HCT: 42.6 (01/17/2010)   Platelets: 223 (01/17/2010) HIV-1 RNA: <48 copies/mL (01/17/2010)  Problem # 2:  ERECTILE DYSFUNCTION (ICD-302.72)  His updated medication list for this problem includes:    Viagra 100 Mg Tabs (Sildenafil citrate) .Marland Kitchen... Take one tablet by mouth as needed.  do not exceed one tablet daily.  Other Orders: Est. Patient Level III ([address]) Future Orders: T-CD4SP ([facility]) (CD4SP) ... 05/02/2010 T-HIV Viral Load [phone]) ... 05/02/2010 T-Comprehensive Metabolic Panel [phone]) ... 05/02/2010 T-CBC w/Diff ([id number]) ... 05/02/2010  Patient Instructions: 1)  Please schedule a follow-up appointment in 3 months, 2 weeks after labs.  Prescriptions: VIAGRA 100 MG TABS (SILDENAFIL CITRATE) Take one tablet by mouth as needed.  Do not exceed one tablet daily.  #12 x 2   Entered and Authorized by:   Yisroel Ramming MD   Signed  by:   Yisroel Ramming MD on 02/01/2010   Method used:   Print then Give to Patient   RxID:   [id number]

## 2010-09-06 NOTE — Miscellaneous (Signed)
Summary: RW update  Clinical Lists Changes  Observations: Added new observation of RWPARTICIP: Yes (04/26/2010 10:08)

## 2010-09-06 NOTE — Progress Notes (Signed)
Summary: Refill/gh  Phone Note Refill Request Message from:  Fax from Pharmacy on January 22, 2010 10:54 AM  Refills Requested: Medication #1:  SUSTIVA 600 MG  TABS Take 1 tablet by mouth at bedtime.  Method Requested: Electronic Initial call taken by: Angelina Ok RN,  January 22, 2010 10:54 AM  Follow-up for Phone Call        Dr Philipp Deputy is now managing Mr Knecht's HIV.  I will refer the refill to her.  Follow-up by: Blanch Media MD,  January 23, 2010 12:10 PM    Prescriptions: SUSTIVA 600 MG  TABS (EFAVIRENZ) Take 1 tablet by mouth at bedtime.  #30 x 6   Entered by:   Starleen Arms CMA   Authorized by:   Yisroel Ramming MD   Signed by:   Starleen Arms CMA on 01/23/2010   Method used:   Electronically to        [facility] [id number] (retail)       [address] [place], [place]  [address]       Ph: [phone]       Fax: [phone]   RxID:   [id number]

## 2010-09-13 ENCOUNTER — Encounter (INDEPENDENT_AMBULATORY_CARE_PROVIDER_SITE_OTHER): Payer: Self-pay | Admitting: *Deleted

## 2010-09-20 NOTE — Miscellaneous (Signed)
  Clinical Lists Changes  Observations: Added new observation of [place]: Medicaid (09/13/2010 15:17) Added new observation of MEDICARINSUR: Medicare (09/13/2010 15:17) Added new observation of PAYOR: More than 1 (09/13/2010 15:17) Added new observation of LATINO/HISP: No (09/13/2010 15:17) Added new observation of RACE: African American (09/13/2010 15:17)

## 2010-09-24 ENCOUNTER — Encounter (INDEPENDENT_AMBULATORY_CARE_PROVIDER_SITE_OTHER): Payer: Self-pay | Admitting: *Deleted

## 2010-10-02 NOTE — Miscellaneous (Signed)
  Clinical Lists Changes  Observations: Added new observation of HIV RISK BEH: Heterosexual contact (09/24/2010 11:54)

## 2010-10-21 LAB — T-HELPER CELL (CD4) - (RCID CLINIC ONLY)
CD4 % Helper T Cell: 18 % — ABNORMAL LOW (ref 33–55)
CD4 T Cell Abs: 360 uL — ABNORMAL LOW (ref 400–2700)

## 2010-11-01 ENCOUNTER — Encounter: Payer: Self-pay | Admitting: Internal Medicine

## 2010-11-01 ENCOUNTER — Encounter (INDEPENDENT_AMBULATORY_CARE_PROVIDER_SITE_OTHER): Payer: Self-pay | Admitting: *Deleted

## 2010-11-01 ENCOUNTER — Ambulatory Visit (INDEPENDENT_AMBULATORY_CARE_PROVIDER_SITE_OTHER): Payer: Medicare Other | Admitting: Internal Medicine

## 2010-11-01 DIAGNOSIS — F528 Other sexual dysfunction not due to a substance or known physiological condition: Secondary | ICD-10-CM

## 2010-11-01 DIAGNOSIS — F1210 Cannabis abuse, uncomplicated: Secondary | ICD-10-CM

## 2010-11-01 DIAGNOSIS — Z9189 Other specified personal risk factors, not elsewhere classified: Secondary | ICD-10-CM

## 2010-11-01 DIAGNOSIS — E785 Hyperlipidemia, unspecified: Secondary | ICD-10-CM

## 2010-11-01 DIAGNOSIS — F17200 Nicotine dependence, unspecified, uncomplicated: Secondary | ICD-10-CM

## 2010-11-01 DIAGNOSIS — B1710 Acute hepatitis C without hepatic coma: Secondary | ICD-10-CM

## 2010-11-01 DIAGNOSIS — B20 Human immunodeficiency virus [HIV] disease: Secondary | ICD-10-CM

## 2010-11-01 LAB — LIPID PANEL
HDL: 48 mg/dL (ref >39)
LDL Cholesterol: 93 mg/dL (ref 0–99)
Total CHOL/HDL Ratio: 3.8 Ratio
Triglycerides: 201 mg/dL — ABNORMAL HIGH (ref <150)
VLDL: 40 mg/dL (ref 0–40)

## 2010-11-01 LAB — CBC
HCT: 41.6 % (ref 39.0–52.0)
Hemoglobin: 13.7 g/dL (ref 13.0–17.0)
MCV: 100.2 fL — ABNORMAL HIGH (ref 78.0–100.0)
RBC: 4.15 MIL/uL — ABNORMAL LOW (ref 4.22–5.81)
RDW: 14 % (ref 11.5–15.5)
WBC: 3.2 10*3/uL — ABNORMAL LOW (ref 4.0–10.5)

## 2010-11-01 LAB — COMPREHENSIVE METABOLIC PANEL
ALT: 65 U/L — ABNORMAL HIGH (ref 0–53)
BUN: 10 mg/dL (ref 6–23)
CO2: 27 mEq/L (ref 19–32)
Calcium: 8.4 mg/dL (ref 8.4–10.5)
Chloride: 105 mEq/L (ref 96–112)
Creat: 0.61 mg/dL (ref 0.40–1.50)
Glucose, Bld: 99 mg/dL (ref 70–99)
Total Bilirubin: 0.3 mg/dL (ref 0.3–1.2)

## 2010-11-01 MED ORDER — EFAVIRENZ 600 MG PO TABS: 600.0000 mg | ORAL_TABLET | Freq: Every day | ORAL | Status: DC

## 2010-11-01 MED ORDER — SILDENAFIL CITRATE 100 MG PO TABS: 100.0000 mg | ORAL_TABLET | ORAL | Status: DC | PRN

## 2010-11-01 MED ORDER — LAMIVUDINE-ZIDOVUDINE 150-300 MG PO TABS: 1.0000 | ORAL_TABLET | Freq: Two times a day (BID) | ORAL | Status: DC

## 2010-11-01 NOTE — Assessment & Plan Note (Signed)
Per note above Recheck Hepatitis C Viral Load today

## 2010-11-01 NOTE — Patient Instructions (Signed)
MAKE AN APPOINTMENT TO THE INFECTION DISEASE CLINIC TO FOLLOW UP ON YOUR MEDICAL CARE-MAKE SURE TO TELL THEM I GOT LABS ON YOU AND DID A TB SKIN TEST  YOU ONLY HAVE REFILLS TO July SO YOU MUST GET AN APPOINTMENT WITH THEM  !!  TALK TO THEM ABOUT MAYBE GETTING TREATMENT FOR YOUR HEPATITIS C  GET EXERCISE-WALK FOR 30-40 MINUTES EVERY DAY-THIS WILL HELP WITH YOUR APPETITE AND YOUR SLEEP. WE DON'T WANT TO TREAT YOU WITH SLEEPING MEDICINES OF ANY KIND.  YOUR REFILLS ARE AT THE PHARMACY, AND GOOD THROUGH TO July 2012.

## 2010-11-01 NOTE — Assessment & Plan Note (Addendum)
Patient counseled on need for tight follow up-was last seen in [facility] 6/11-too long of a time interval Counseled on safer sex practices, and consistent use of condoms  Obtain full lab panel-HCV and HIV Viral loads, CD4, CBC, CMET, Lipid panel, and check PPD  Advised him to review possible need for evaluation and management of his hepatitis C (has not been drinking for 4-5 years)-he should discuss this with the physicians at the [facility]

## 2010-11-01 NOTE — Assessment & Plan Note (Signed)
Says he is smoking a dime bag "at least" a day Advised on risks to lung function, cognitive function, increased risks of relapse in ETOH use- ....that did not make much of an impact.Marland KitchenMarland Kitchen

## 2010-11-01 NOTE — Progress Notes (Signed)
  Subjective:    Patient ID: Eric Ford, male    DOB: 1956-04-29, 55 y.o.   MRN: [id number]  HPI Pleasant gentleman with problems as listed-these were reveiwed and revised today by me No interval issues-fair energy level, not esp active-says he has some insomnia. Mows his yard every other week, that being his only exercise.  Review of Systems No chest pain or claudication, has mild DOE, smoking quite a lot of MJ (dime bag a day at least) Appetite fair.     Objective:   Physical Exam  Constitutional: He appears well-developed and well-nourished.       Thin, but NAD  HENT:       Poor dentition-has scattered carious teeth lower, none upper  Eyes: Conjunctivae and EOM are normal. No scleral icterus.  Neck: Normal range of motion. Neck supple.  Cardiovascular: Normal rate, regular rhythm and normal heart sounds.   Pulmonary/Chest: Effort normal. He has no wheezes.  Abdominal: Soft.         Liver edge non-tender, no splenomegaly or spider angioma  Lymphadenopathy:    He has no cervical adenopathy.       No LAD  Skin:       Normal, no rashes           Assessment & Plan:   SEE BY PROBLEM

## 2010-11-01 NOTE — Assessment & Plan Note (Signed)
Check fasting lipid panel today. 

## 2010-11-01 NOTE — Assessment & Plan Note (Signed)
Last colonoscopy report from 2003-given his age, risks rectal cancer and history of smoking, referred to GI for screening colonoscopy

## 2010-11-02 LAB — T-HELPER CELLS (CD4) COUNT (NOT AT ARMC)
CD4 % Helper T Cell: 22 % — ABNORMAL LOW (ref 33–55)
CD4 T Cell Abs: 420 uL (ref 400–2700)

## 2010-11-02 LAB — HIV-1 RNA QUANT-NO REFLEX-BLD
HIV 1 RNA Quant: 111 {copies}/mL — ABNORMAL HIGH (ref <20)
HIV-1 RNA Quant, Log: 2.05 {Log} — ABNORMAL HIGH (ref <1.30)

## 2010-11-05 ENCOUNTER — Ambulatory Visit (INDEPENDENT_AMBULATORY_CARE_PROVIDER_SITE_OTHER): Payer: Medicare Other | Admitting: *Deleted

## 2010-11-05 DIAGNOSIS — Z111 Encounter for screening for respiratory tuberculosis: Secondary | ICD-10-CM

## 2010-11-06 NOTE — Letter (Signed)
Summary: Pre Visit Letter Revised  [facility]  [address] [place], [place] [address]   Phone: [phone]  Fax: [phone]        11/01/2010 MRN: [id number] Eric Ford [address] [place], [place]  [address]  [place]             Procedure Date:  12-05-10           Direct Colon---Dr. Christella Hartigan   Welcome to the Gastroenterology Division at [facility].    You are scheduled to see a nurse for your pre-procedure visit on 11-21-10 at 10:30a.m. on the 3rd floor at [facility], [address]  We ask that you try to arrive at our office 15 minutes prior to your appointment time to allow for check-in.  Please take a minute to review the attached form.  If you answer "Yes" to one or more of the questions on the first page, we ask that you call the person listed at your earliest opportunity.  If you answer "No" to all of the questions, please complete the rest of the form and bring it to your appointment.    Your nurse visit will consist of discussing your medical and surgical history, your immediate family medical history, and your medications.   If you are unable to list all of your medications on the form, please bring the medication bottles to your appointment and we will list them.  We will need to be aware of both prescribed and over the counter drugs.  We will need to know exact dosage information as well.    Please be prepared to read and sign documents such as consent forms, a financial agreement, and acknowledgement forms.  If necessary, and with your consent, a friend or relative is welcome to sit-in on the nurse visit with you.  Please bring your insurance card so that we may make a copy of it.  If your insurance requires a referral to see a specialist, please bring your referral form from your primary care physician.  No co-pay is required for this nurse visit.     If you cannot keep your appointment, please call [phone] to cancel or reschedule prior  to your appointment date.  This allows [place] the opportunity to schedule an appointment for another patient in need of care.    Thank you for choosing [facility] for your medical needs.  We appreciate the opportunity to care for you.  Please visit [place] at our website  to learn more about our practice.  Sincerely, The Gastroenterology Division

## 2010-11-07 LAB — TB SKIN TEST: TB Skin Test: NEGATIVE mm

## 2010-11-21 ENCOUNTER — Ambulatory Visit (AMBULATORY_SURGERY_CENTER): Payer: Medicare Other | Admitting: *Deleted

## 2010-11-21 VITALS — Ht 66.0 in | Wt 111.0 lb

## 2010-11-21 DIAGNOSIS — Z1211 Encounter for screening for malignant neoplasm of colon: Secondary | ICD-10-CM

## 2010-11-21 MED ORDER — PEG-KCL-NACL-NASULF-NA ASC-C 100 G PO SOLR: ORAL | Status: DC

## 2010-12-04 ENCOUNTER — Encounter: Payer: Self-pay | Admitting: Gastroenterology

## 2010-12-05 ENCOUNTER — Ambulatory Visit (AMBULATORY_SURGERY_CENTER): Payer: Medicare Other | Admitting: Gastroenterology

## 2010-12-05 ENCOUNTER — Encounter: Payer: Self-pay | Admitting: Gastroenterology

## 2010-12-05 VITALS — BP 140/87 | HR 70 | Temp 97.1°F | Resp 14 | Ht 66.0 in | Wt 115.0 lb

## 2010-12-05 DIAGNOSIS — D126 Benign neoplasm of colon, unspecified: Secondary | ICD-10-CM

## 2010-12-05 DIAGNOSIS — Z86010 Personal history of colonic polyps: Secondary | ICD-10-CM

## 2010-12-05 DIAGNOSIS — Z1211 Encounter for screening for malignant neoplasm of colon: Secondary | ICD-10-CM

## 2010-12-05 MED ORDER — SODIUM CHLORIDE 0.9 % IV SOLN: 500.0000 mL | INTRAVENOUS | Status: DC

## 2010-12-05 NOTE — Patient Instructions (Signed)
Read the handouts given to you by your recovery room nurse.  We will send your pathology results to you in a bout two weeks.   You will need another colonoscopy in about 3-5 yrs.  Resume your regular medications.  If you have any questions, call us at [phone]. Thank-you

## 2010-12-06 ENCOUNTER — Telehealth: Payer: Self-pay | Admitting: *Deleted

## 2010-12-06 NOTE — Telephone Encounter (Signed)

## 2010-12-18 NOTE — Discharge Summary (Signed)
NAME:  RUBY, DILONE NO.:  [id number]   MEDICAL RECORD NO.:  [id number]          PATIENT TYPE:  INP   LOCATION:  4739                         FACILITY:  [facility]   PHYSICIAN:  Madaline Guthrie, M.D.    DATE OF BIRTH:  1955-10-25   DATE OF ADMISSION:  12/10/2007  DATE OF DISCHARGE:  12/12/2007                               DISCHARGE SUMMARY   DISCHARGE DIAGNOSES:  1. Chest pain secondary to gastroesophageal reflux disease versus      gastritis, likely non-steroidal anti-inflammatory drug-induced.  2. Human immunodeficiency virus, last CD4 count of 290, August 14, 2007, with a viral load pending.  3. Anemia with hemoglobin 12, normal B12 level.  4. Marijuana use.  5. History of opportunistic infection with lower CD4 counts in the      past.   DISCHARGE MEDICATIONS:  1. Sustiva 600 mg by mouth once at night,  2. Combivir p.o. 1 tablet twice a day.  3. Bactrim double-strength 1 tablet on Mondays, Wednesdays, and      Fridays.  4. Viagra 100 mg once daily as needed.  5. Protonix 40 mg once a day.  6. Maalox 5-10 mL once every 4 hours with meals as needed for      heartburn or gas.   DISPOSITION FOLLOWUP:  Mr. Mofield is to follow with Dr. Clent Ridges in the  outpatient clinic on Dec 29, 2007, at 3:30, at which time we will follow  up on whether he is still having abdominal pain.  He is also to follow  up in the clinic on Monday, Dec 14, 2007, to make sure that his  hemoglobin is not so trending down that was done by 12 hours before this  dictation was performed as hemoglobin was stable.  He was told  specifically to avoid taking NSAIDs for at least 1 year and was to  continue following up in the infectious disease clinic as he was  previously.   ADMISSION HISTORY AND PHYSICAL:  This is a 55 year old male with a past  medical history of HIV, distant history of PCP pneumonia, rectal and  sigmoid polyp, status post polypectomy May 2003,  history of hepatitis  C,   hepatitis B, genital herpes, HIV as previously stated, GERD, smoking  and marijuana abuse who presented with one and half week of chest pain  and chest with tightness in quality, this would last 2-3 minutes, it  would be intermittent and had no alleviating or exacerbating factors.  He states he was taking Advil for the chest pain with no relief.  He  also says he had 5 to 6 episodes of vomiting of food, beginning then  water.  Denied coffee-ground color.  No blood in his vomit.  They admit  to see vomiting 2 or 3 times, states he has been losing weight and he  has been having some epigastric, back, and abdominal pain and constant  dull aching with no radiation.  This was alleviated with Aciphex that he  obtained over-the-counter.   PHYSICAL EXAM:  VITAL SIGNS:  Temperature 97.8, blood pressure  149/97,  pulse 80, and respiratory rate 20, O2 sat 97% on room air.  GENERAL APPEARANCE:  Lying on bed in no acute distress, but he was  somewhat cachectic and slow to respond.  HEENT:  Eye exam was normal.  ENT was normal with moist mucosa.  NECK:  Supple with no thyromegaly.  LUNGS:  He had bilateral diffuse rhonchi with some fine crackles.  CARDIOVASCULAR:  S1 and S2.  No murmur.  Regular rate and rhythm.  No  JVD.  Bowel sounds are positive, soft with mild tenderness in the left  upper quadrant and epigastric area.  EXTREMITIES:  His pulses were positive.  No edema.  His skin was dry.  He had no lymphadenopathy.  NEUROLOGIC:  Nonfocal.  Cranial nerves 2 through 12 intact.  Sensation  grossly intact.  Motor strength 5/5.  Circ was appropriate.   LABORATORY DATA:  Sodium 130, potassium 4.5, chloride 92, bicarb 27, BUN  10, creatinine 0.79, glucose 116 with an anion gap of 11, bilirubin  0.55, alk phosphatase 112, AST 96, it was 82 in the past, ALT 58, it was  44 in the past, protein 8.3, albumin 3.6, and calcium 9.2.  His CK was  1253.  CD4 count again on January 2009 was 290.  His lipase was  31,  troponin was 0.05, and CK-MB 18.  He had an few abdominal series that  showed no focal airway disease with gas and stool in the colon.  Bowel  gas pattern that was not specific.  For more details history and  physical, please refer to chart.   HOSPITAL COURSE/PROBLEMS:  1. Chest pain.  The differential for Mr. Orozco was cardiac etiology      versus gastroenterology etiology, with gastroenterology etiology      being more likely.  We also want to rule out infectious causes.  He      is potential being immunocompromised.  Chest x-ray did not show any      signs of infection.  His vital signs were stable with no fever.      Symptoms were relieved with Aciphex.  He had been taking Advil for      some time, but he was guaiac-negative, not acute, therefore we      ruled him out of normal cardiac enzymes and he did cycle negative      x3.  His EKGs also were was continuously negative.  CK was elevated      likely because of his profuse vomiting, relative index was always      around 1.  I, therefore, gave him Carafate, Protonix, GI cocktail,      and his symptoms dramatically improved quite rapidly.  He was never      guaiac-positive, but hemoglobin did slowly trend down from 14 to 13      and then 12; however, he had no symptoms with this and again did      not notice any blood in the stool.  Unfortunately, while he was      guaiac negative, it was not recorded in the E-chart.  We tried to      get an Helicobacter pylori stool antigen, but instead an      Helicobacter pylori antibody IgG was recorded and it was greater      than 8, but that was a nonspecific finding.  With improvement in      symptoms of being so dramatic with the cessation of his Advil and  supplying of Protonix if his cardiac etiology ruled out, which was      most likely non-steroidal anti-inflammatory drugs-induced gastritis      and he will be discharged on Protonix.  I did not interact with his       antiretroviral medications, and his hemoglobin to be followed up in      the short-term as an outpatient within the next 2 days when he      returned on Monday, his hemoglobin was stable.  2. Human immunodeficiency virus.  Continues to have a viral load      pending in the clinic and followup.  Also, has significant problems      and CD4 cannot be checked.  We will continue on Bactrim prophylaxis      for now in addition to resume his antiretroviral medications, which      he states he is taking consistently.  3. Smoking.  He was advised to quit, he declined.  4. Elevated CK as well as this is most likely secondary to      dehydration, vomiting was trending down, so issue was considered      resolved.  All other hospital issues were deemed not acute, being      followed with his hepatitis B, hepatitis C, mildly elevated      cholesterol with an LDL of 112 and a triglyceride of 203, again all      these things can be followed as an outpatient.   DISCHARGE LABS AND VITALS:  RBC folate 226.  Urine culture returned  negative.  Cardiac panel with a CK 880, trending down; CK-MB 10,  trending down relatively, troponin-I 0.01.  Sodium 135, potassium 3.9,  chloride  104, bicarb 24, blood glucose 97, BUN 5, creatinine 0.69, calcium 7.9,  white blood cell count 4.7, hemoglobin 12.1, hematocrit 36.6, and  platelets 153.  Vitamin B12 817, and TSH 1.07.  H. pylori antibody IgG  greater than 8.  Vital signs; temperature 98.4, pulse 82, respirations  18, blood pressure 134/72, and O2 saturations 100% on room air.      Valetta Close, M.D.  Electronically Signed      Madaline Guthrie, M.D.  Electronically Signed    JC/MEDQ  D:  12/14/2007  T:  12/15/2007  Job:  [id number]
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
## 2010-12-27 ENCOUNTER — Telehealth: Payer: Self-pay | Admitting: *Deleted

## 2010-12-27 NOTE — Telephone Encounter (Signed)
Patient c/o vomiting and upset stomach since having colonoscopy that was ordered through [facility].  Patient given number for [facility] and he said he would call and get appointment scheduled. Wendall Mola CMA

## 2011-01-04 ENCOUNTER — Telehealth: Payer: Self-pay | Admitting: Internal Medicine

## 2011-01-04 ENCOUNTER — Emergency Department (HOSPITAL_COMMUNITY): Payer: Medicare Other

## 2011-01-04 ENCOUNTER — Emergency Department (HOSPITAL_COMMUNITY)
Admission: EM | Admit: 2011-01-04 | Discharge: 2011-01-04 | Disposition: A | Discharge status: Home / Self Care | Payer: Medicare Other | Attending: Emergency Medicine | Admitting: Emergency Medicine

## 2011-01-04 DIAGNOSIS — R05 Cough: Secondary | ICD-10-CM | POA: Insufficient documentation

## 2011-01-04 DIAGNOSIS — R55 Syncope and collapse: Secondary | ICD-10-CM | POA: Insufficient documentation

## 2011-01-04 DIAGNOSIS — R42 Dizziness and giddiness: Secondary | ICD-10-CM | POA: Insufficient documentation

## 2011-01-04 DIAGNOSIS — R634 Abnormal weight loss: Secondary | ICD-10-CM | POA: Insufficient documentation

## 2011-01-04 DIAGNOSIS — K209 Esophagitis, unspecified without bleeding: Secondary | ICD-10-CM | POA: Insufficient documentation

## 2011-01-04 DIAGNOSIS — R079 Chest pain, unspecified: Secondary | ICD-10-CM | POA: Insufficient documentation

## 2011-01-04 DIAGNOSIS — Z21 Asymptomatic human immunodeficiency virus [HIV] infection status: Secondary | ICD-10-CM | POA: Insufficient documentation

## 2011-01-04 DIAGNOSIS — R10816 Epigastric abdominal tenderness: Secondary | ICD-10-CM | POA: Insufficient documentation

## 2011-01-04 DIAGNOSIS — R059 Cough, unspecified: Secondary | ICD-10-CM | POA: Insufficient documentation

## 2011-01-04 DIAGNOSIS — R110 Nausea: Secondary | ICD-10-CM | POA: Insufficient documentation

## 2011-01-04 DIAGNOSIS — R1013 Epigastric pain: Secondary | ICD-10-CM | POA: Insufficient documentation

## 2011-01-04 LAB — COMPREHENSIVE METABOLIC PANEL
ALT: 40 U/L (ref 0–53)
AST: 48 U/L — ABNORMAL HIGH (ref 0–37)
Alkaline Phosphatase: 108 U/L (ref 39–117)
CO2: 26 mEq/L (ref 19–32)
Calcium: 9 mg/dL (ref 8.4–10.5)
GFR calc Af Amer: >60 mL/min (ref >60)
GFR calc non Af Amer: >60 mL/min (ref >60)
Potassium: 3.5 mEq/L (ref 3.5–5.1)
Sodium: 136 mEq/L (ref 135–145)

## 2011-01-04 LAB — CBC
HCT: 42.2 % (ref 39.0–52.0)
Hemoglobin: 14.3 g/dL (ref 13.0–17.0)
MCHC: 33.9 g/dL (ref 30.0–36.0)
RBC: 4.35 MIL/uL (ref 4.22–5.81)
WBC: 4.7 10*3/uL (ref 4.0–10.5)

## 2011-01-04 LAB — CK TOTAL AND CKMB (NOT AT ARMC): Total CK: 388 U/L — ABNORMAL HIGH (ref 7–232)

## 2011-01-04 NOTE — Telephone Encounter (Signed)
REceived a phone call from ED. Patient was evaluated for an atypical CP likely 2/2 esophagitis. POC negative and EKG without acute changes. Patient was already evaluated by a cardiologist. Will schedule a follow up appointment in [facility] ASAP.

## 2011-01-10 ENCOUNTER — Ambulatory Visit: Payer: Medicare Other | Admitting: Internal Medicine

## 2011-03-13 ENCOUNTER — Other Ambulatory Visit: Payer: Self-pay | Admitting: *Deleted

## 2011-03-13 NOTE — Telephone Encounter (Signed)
Refill for Combivir denied per Dr. Josem Kaufmann -pt's phone disconnected.  Denial was faxed back to [facility] with explanation why denial of medication.  Note also states that pt will need to make an appointment.

## 2011-03-14 ENCOUNTER — Ambulatory Visit (INDEPENDENT_AMBULATORY_CARE_PROVIDER_SITE_OTHER): Payer: Medicare Other | Admitting: Internal Medicine

## 2011-03-14 ENCOUNTER — Encounter: Payer: Self-pay | Admitting: Internal Medicine

## 2011-03-14 DIAGNOSIS — B1710 Acute hepatitis C without hepatic coma: Secondary | ICD-10-CM

## 2011-03-14 DIAGNOSIS — Z23 Encounter for immunization: Secondary | ICD-10-CM

## 2011-03-14 DIAGNOSIS — Z0000 Encounter for general adult medical examination without abnormal findings: Secondary | ICD-10-CM | POA: Insufficient documentation

## 2011-03-14 DIAGNOSIS — Z9189 Other specified personal risk factors, not elsewhere classified: Secondary | ICD-10-CM

## 2011-03-14 DIAGNOSIS — F528 Other sexual dysfunction not due to a substance or known physiological condition: Secondary | ICD-10-CM

## 2011-03-14 DIAGNOSIS — Z21 Asymptomatic human immunodeficiency virus [HIV] infection status: Secondary | ICD-10-CM

## 2011-03-14 DIAGNOSIS — B20 Human immunodeficiency virus [HIV] disease: Secondary | ICD-10-CM

## 2011-03-14 DIAGNOSIS — N529 Male erectile dysfunction, unspecified: Secondary | ICD-10-CM

## 2011-03-14 MED ORDER — LAMIVUDINE-ZIDOVUDINE 150-300 MG PO TABS: 1.0000 | ORAL_TABLET | Freq: Two times a day (BID) | ORAL | Status: DC

## 2011-03-14 MED ORDER — SILDENAFIL CITRATE 50 MG PO TABS: 50.0000 mg | ORAL_TABLET | Freq: Every day | ORAL | Status: DC | PRN

## 2011-03-14 MED ORDER — EFAVIRENZ 600 MG PO TABS: 600.0000 mg | ORAL_TABLET | Freq: Every day | ORAL | Status: DC

## 2011-03-14 NOTE — Assessment & Plan Note (Signed)
His colonoscopy results from 12/05/2010 were discussed.

## 2011-03-14 NOTE — Assessment & Plan Note (Signed)
He states he has been taking his medications regularly . -Recheck his CD4 and viral load.  -Continue current dose of Combivir and Sustiva for now. -Counseled him on safe sex practices and use of condoms.

## 2011-03-14 NOTE — Assessment & Plan Note (Signed)
His last  Viral load was 246,0000 in 03/12. Quit drinking alcohol several years ago. Refer him to  ID clinic to discuss the treatment options.

## 2011-03-14 NOTE — Assessment & Plan Note (Addendum)
Reports medicines work best for him .Will give him a prescription for Viagra.

## 2011-03-14 NOTE — Progress Notes (Signed)
  Subjective:    Patient ID: Eric Ford, male    DOB: 05-25-1956, 55 y.o.   MRN: [id number]  HPI: 55 year old man with past medical history significant for HIV( last CD-4 -410, VL-111 copies/ml), hepatitis C comes to the clinic for followup visit.   He denies any new complaints today. He was complaining of erectile dysfunctioning and was requesting for Viagra. He states that he has been taking his medications regularly.    Review of Systems  Constitutional: Negative for fever, chills, activity change, appetite change and fatigue.  HENT: Negative for congestion, rhinorrhea, sneezing and postnasal drip.   Eyes: Negative for photophobia and visual disturbance.  Respiratory: Positive for cough. Negative for shortness of breath, wheezing and stridor.   Cardiovascular: Negative for chest pain, palpitations and leg swelling.  Genitourinary: Negative for dysuria and difficulty urinating.  Musculoskeletal: Negative for arthralgias.       Objective:   Physical Exam  Constitutional: He is oriented to person, place, and time. He appears well-developed and well-nourished. No distress.  HENT:  Head: Normocephalic and atraumatic.  Eyes: Conjunctivae and EOM are normal. Pupils are equal, round, and reactive to light.  Neck: Normal range of motion. Neck supple. No JVD present. No tracheal deviation present. No thyromegaly present.  Cardiovascular: Normal rate, regular rhythm and intact distal pulses.  Exam reveals no gallop and no friction rub.   No murmur heard. Pulmonary/Chest: Effort normal and breath sounds normal. No stridor.       Coarse breath sounds bilaterally.  Abdominal: Soft. Bowel sounds are normal. He exhibits no distension and no mass. There is no tenderness. There is no rebound and no guarding.  Musculoskeletal: Normal range of motion. He exhibits no edema and no tenderness.  Lymphadenopathy:    He has no cervical adenopathy.  Neurological: He is alert and oriented to person,  place, and time. He has normal reflexes. He displays normal reflexes. No cranial nerve deficit. He exhibits normal muscle tone. Coordination normal.  Skin: Skin is warm. He is not diaphoretic.          Assessment & Plan:

## 2011-03-14 NOTE — Assessment & Plan Note (Signed)
He was given a Tdap shot today.

## 2011-03-14 NOTE — Patient Instructions (Signed)
Please schedule a follow up appointment in 3 months. Please take your medicines as prescribed. 

## 2011-03-18 LAB — HIV-1 RNA QUANT-NO REFLEX-BLD: HIV-1 RNA Quant, Log: 2.16 {Log} — ABNORMAL HIGH (ref <1.30)

## 2011-04-10 ENCOUNTER — Ambulatory Visit (INDEPENDENT_AMBULATORY_CARE_PROVIDER_SITE_OTHER): Payer: Medicare Other | Admitting: Adult Health

## 2011-04-10 ENCOUNTER — Encounter: Payer: Self-pay | Admitting: Adult Health

## 2011-04-10 DIAGNOSIS — B20 Human immunodeficiency virus [HIV] disease: Secondary | ICD-10-CM

## 2011-04-10 DIAGNOSIS — Z79899 Other long term (current) drug therapy: Secondary | ICD-10-CM

## 2011-04-10 MED ORDER — EFAVIRENZ-EMTRICITAB-TENOFOVIR 600-200-300 MG PO TABS: 1.0000 | ORAL_TABLET | Freq: Every day | ORAL | Status: DC

## 2011-04-10 NOTE — Patient Instructions (Signed)
1. Stop Sustiva and Combivir 2. Begin Atripla 1 tablet at bedtime 3. Return to clinic in 4 weeks for labs. 4. Schedule follow-up in 6 weeks.

## 2011-04-24 LAB — T-HELPER CELLS (CD4) COUNT (NOT AT ARMC)
CD4 % Helper T Cell: 18 — ABNORMAL LOW
CD4 T Cell Abs: 290 — ABNORMAL LOW

## 2011-05-01 LAB — BASIC METABOLIC PANEL
BUN: 11
BUN: 5 — ABNORMAL LOW
CO2: 24
Calcium: 7.9 — ABNORMAL LOW
Chloride: 104
Creatinine, Ser: 0.69
Creatinine, Ser: 0.74
GFR calc Af Amer: >60
GFR calc non Af Amer: >60
Glucose, Bld: 97

## 2011-05-01 LAB — CBC
HCT: 41.4
HCT: 45.9
Hemoglobin: 13.5
MCHC: 33
MCHC: 33.9
MCV: 103.5 — ABNORMAL HIGH
MCV: 103.6 — ABNORMAL HIGH
Platelets: 153
Platelets: 180
Platelets: 200
RBC: 3.53 — ABNORMAL LOW
RBC: 4.21 — ABNORMAL LOW
RDW: 13
RDW: 13.4
WBC: 4.8
WBC: 5
WBC: 5.3

## 2011-05-01 LAB — DIFFERENTIAL
Basophils Absolute: 0.1
Basophils Relative: 1
Eosinophils Absolute: 0
Eosinophils Relative: 1
Lymphs Abs: 1.5
Monocytes Absolute: 0.4
Monocytes Relative: 9
Neutrophils Relative %: 58

## 2011-05-01 LAB — URINALYSIS, ROUTINE W REFLEX MICROSCOPIC
Glucose, UA: NEGATIVE
Hgb urine dipstick: NEGATIVE
Specific Gravity, Urine: 1.023
pH: 6

## 2011-05-01 LAB — LIPID PANEL
HDL: 30 — ABNORMAL LOW
VLDL: 41 — ABNORMAL HIGH

## 2011-05-01 LAB — CK TOTAL AND CKMB (NOT AT ARMC)
CK, MB: 13.9 — ABNORMAL HIGH
CK, MB: 15.7 — ABNORMAL HIGH
CK, MB: 18 — ABNORMAL HIGH
Relative Index: 1.4
Total CK: 1093 — ABNORMAL HIGH
Total CK: 1253 — ABNORMAL HIGH

## 2011-05-01 LAB — CARDIAC PANEL(CRET KIN+CKTOT+MB+TROPI)
Relative Index: 1.1
Total CK: 1043 — ABNORMAL HIGH
Total CK: 1162 — ABNORMAL HIGH
Total CK: 883 — ABNORMAL HIGH
Troponin I: 0.01
Troponin I: 0.01

## 2011-05-01 LAB — COMPREHENSIVE METABOLIC PANEL
ALT: 44
AST: 96 — ABNORMAL HIGH
Albumin: 3.6
Alkaline Phosphatase: 112
Calcium: 9.2
Chloride: 92 — ABNORMAL LOW
GFR calc Af Amer: >60
GFR calc Af Amer: >60
Glucose, Bld: 121 — ABNORMAL HIGH
Potassium: 4.5
Sodium: 130 — ABNORMAL LOW
Sodium: 135
Total Bilirubin: 0.5
Total Protein: 7.7

## 2011-05-01 LAB — ETHANOL: Alcohol, Ethyl (B): <5

## 2011-05-01 LAB — TROPONIN I: Troponin I: 0.02

## 2011-05-01 LAB — URINE CULTURE: Culture: NO GROWTH

## 2011-05-01 LAB — APTT: aPTT: 28

## 2011-05-01 LAB — RAPID URINE DRUG SCREEN, HOSP PERFORMED
Amphetamines: NOT DETECTED
Cocaine: NOT DETECTED
Opiates: POSITIVE — AB
Tetrahydrocannabinol: POSITIVE — AB

## 2011-05-01 LAB — FOLATE RBC: RBC Folate: 226

## 2011-05-01 LAB — VITAMIN B12: Vitamin B-12: 817 (ref 211–911)

## 2011-05-01 LAB — POCT CARDIAC MARKERS: CKMB, poc: 18.8

## 2011-05-01 LAB — HIV-1 RNA QUANT-NO REFLEX-BLD
HIV 1 RNA Quant: <50 copies/mL (ref <50)
HIV-1 RNA Quant, Log: <1.7 (ref <1.70)

## 2011-05-01 LAB — PROTIME-INR: Prothrombin Time: 12.8

## 2011-05-02 ENCOUNTER — Other Ambulatory Visit: Payer: Self-pay | Admitting: *Deleted

## 2011-05-02 MED ORDER — ENSURE PO LIQD: 1.0000 | Freq: Two times a day (BID) | ORAL | Status: DC

## 2011-05-07 ENCOUNTER — Telehealth: Payer: Self-pay | Admitting: *Deleted

## 2011-05-07 NOTE — Telephone Encounter (Signed)
Pt has been on Viagra 100 mg.  Last prescription was written for 60 mg.  What dosage would you like for the pt to take.

## 2011-05-09 ENCOUNTER — Other Ambulatory Visit: Payer: Self-pay | Admitting: Infectious Diseases

## 2011-05-09 ENCOUNTER — Other Ambulatory Visit: Payer: Medicare Other

## 2011-05-09 DIAGNOSIS — B20 Human immunodeficiency virus [HIV] disease: Secondary | ICD-10-CM

## 2011-05-09 DIAGNOSIS — Z79899 Other long term (current) drug therapy: Secondary | ICD-10-CM

## 2011-05-09 LAB — MICROALBUMIN / CREATININE URINE RATIO: Microalb, Ur: 0.94 mg/dL (ref 0.00–1.89)

## 2011-05-09 LAB — CBC WITH DIFFERENTIAL/PLATELET
Basophils Relative: 1 % (ref 0–1)
Eosinophils Absolute: 0.1 10*3/uL (ref 0.0–0.7)
HCT: 44 % (ref 39.0–52.0)
Hemoglobin: 14.4 g/dL (ref 13.0–17.0)
Lymphs Abs: 2.1 10*3/uL (ref 0.7–4.0)
MCH: 32.6 pg (ref 26.0–34.0)
MCHC: 32.7 g/dL (ref 30.0–36.0)
Monocytes Absolute: 0.5 10*3/uL (ref 0.1–1.0)
Monocytes Relative: 11 % (ref 3–12)
Neutrophils Relative %: 34 % — ABNORMAL LOW (ref 43–77)
RBC: 4.42 MIL/uL (ref 4.22–5.81)

## 2011-05-09 LAB — PHOSPHORUS: Phosphorus: 3.3 mg/dL (ref 2.3–4.6)

## 2011-05-09 LAB — COMPLETE METABOLIC PANEL WITH GFR
Albumin: 4.1 g/dL (ref 3.5–5.2)
Alkaline Phosphatase: 99 U/L (ref 39–117)
BUN: 11 mg/dL (ref 6–23)
CO2: 25 mEq/L (ref 19–32)
GFR, Est African American: >60 mL/min (ref >60)
GFR, Est Non African American: >60 mL/min (ref >60)
Glucose, Bld: 96 mg/dL (ref 70–99)
Potassium: 4.3 mEq/L (ref 3.5–5.3)
Sodium: 138 mEq/L (ref 135–145)
Total Bilirubin: 0.3 mg/dL (ref 0.3–1.2)
Total Protein: 8.5 g/dL — ABNORMAL HIGH (ref 6.0–8.3)

## 2011-05-09 LAB — URINALYSIS, ROUTINE W REFLEX MICROSCOPIC
Glucose, UA: NEGATIVE mg/dL
Leukocytes, UA: NEGATIVE
Protein, ur: NEGATIVE mg/dL
Urobilinogen, UA: 0.2 mg/dL (ref 0.0–1.0)

## 2011-05-10 NOTE — Telephone Encounter (Signed)
I gave him refills for 50 mg tablet , you may refill the same strength.  Thanks, [employer]

## 2011-05-23 ENCOUNTER — Encounter: Payer: Self-pay | Admitting: Internal Medicine

## 2011-05-23 ENCOUNTER — Ambulatory Visit (INDEPENDENT_AMBULATORY_CARE_PROVIDER_SITE_OTHER): Payer: Medicare Other | Admitting: Internal Medicine

## 2011-05-23 VITALS — BP 148/85 | HR 67 | Temp 97.7°F | Ht 66.0 in | Wt 107.0 lb

## 2011-05-23 DIAGNOSIS — B20 Human immunodeficiency virus [HIV] disease: Secondary | ICD-10-CM

## 2011-05-23 DIAGNOSIS — Z23 Encounter for immunization: Secondary | ICD-10-CM

## 2011-05-23 NOTE — Assessment & Plan Note (Signed)
He continues to do well on this new regimen without any complaints. His virus is undetectable and therefore he can continue with this regimen. He will followup in 4 months with his primary provider. He did receive condoms here and cons of use was discussed for prevention. He is pleased with the regimen and will continue.

## 2011-05-23 NOTE — Progress Notes (Signed)
  Subjective:    Patient ID: Eric Ford, male    DOB: [birth year], 55 y.o.   MRN: [id number]  HPI he comes in today for followup after having switched from his afebrile and is in Combivir to Atripla. He states he been continuing to tolerate it well and has no complaints. He states he does not miss any doses and has otherwise had no problems. His only complaint is that his Viagra dose which was previously 100 mg was changed to 50 mg by his primary care physician.    Review of Systems  Constitutional: Negative for fever, chills, activity change, appetite change, fatigue and unexpected weight change.  HENT: Negative for congestion and neck pain.   Eyes: Positive for redness. Negative for itching.       + hyphema  Respiratory: Negative for cough, chest tightness, shortness of breath and wheezing.   Cardiovascular: Negative for chest pain and palpitations.  Gastrointestinal: Negative for nausea, diarrhea, constipation and abdominal distention.  Genitourinary: Negative for dysuria.  Musculoskeletal: Negative for myalgias and arthralgias.  Skin: Negative for rash.  Neurological: Negative for light-headedness and headaches.  Hematological: Negative for adenopathy.  Psychiatric/Behavioral: Negative for dysphoric mood.       Objective:   Physical Exam  Constitutional: He is oriented to person, place, and time. He appears well-developed and well-nourished.  HENT:  Mouth/Throat: No oropharyngeal exudate.       Poor dentition  Eyes: No scleral icterus.  Cardiovascular: Normal rate, regular rhythm and normal heart sounds.   No murmur heard. Pulmonary/Chest: Effort normal and breath sounds normal. He has no wheezes.  Abdominal: Soft. Bowel sounds are normal. There is no tenderness.  Lymphadenopathy:    He has no cervical adenopathy.  Neurological: He is alert and oriented to person, place, and time.  Skin: Skin is warm and dry. No rash noted.  Psychiatric: He has a normal mood and affect.  His behavior is normal.          Assessment & Plan:

## 2011-05-23 NOTE — Patient Instructions (Signed)
Return in 4 months

## 2011-06-10 ENCOUNTER — Encounter: Payer: Self-pay | Admitting: Internal Medicine

## 2011-06-10 NOTE — Progress Notes (Signed)
I called the patient on the listed phone number to readjust the dose for his viagra- but it seems like that phone number has been disconnected.  Patient expressed this concern with his visit with Dr. Luciana Axe.

## 2011-09-09 NOTE — Assessment & Plan Note (Signed)
Clinically stable on current regimen. However, in the entrance to the treatment simplification, with a twice a day to a daily, regimen, we will discontinue his Combivir and Sustiva and begin Atripla 1 tablet by mouth each bedtime. Rationale for treatment. Switch, and treatment regimen , restrictions, drug effects, side effects, ADRs, and potential toxicities were discussed in detail. He verbalized his understanding of the information provided.  Counseling provided on prevention of transmission of HIV. Condoms offered: Yes} Medication adherence discussed with patient.  Follow up visit in 6 weeks with labs 2 weeks prior to appointment. Patient acknowledged information provided to them and agreed with plan of care.

## 2011-09-09 NOTE — Progress Notes (Signed)
Subjective:    Patient ID: Eric Ford is a 56 y.o. male.  Chief Complaint: HIV Follow-up Visit IRVIN BASTIN is here for follow-up of HIV infection. He is feeling unchanged since his last visit.  He claims continued adherence to therapy with good tolerance and no complications. There are not additional complaints.  he did express interest in changing his antiretroviral regimen from a twice a day to a daily regimen.   Data Review: Diagnostic studies reviewed.  Review of Systems - General ROS: negative for - fatigue, malaise or sleep disturbance Psychological ROS: negative for - anxiety, behavioral disorder, concentration difficulties, depression or mood swings Ophthalmic ROS: negative for - blurry vision, decreased vision or loss of vision Endocrine ROS: negative for - malaise/lethargy, palpitations or polydipsia/polyuria Respiratory ROS: no cough, shortness of breath, or wheezing Cardiovascular ROS: no chest pain or dyspnea on exertion Gastrointestinal ROS: no abdominal pain, change in bowel habits, or black or bloody stools Musculoskeletal ROS: negative Neurological ROS: negative Dermatological ROS: negative for rash and skin lesion changes  Objective:  General appearance: alert, cooperative, appears stated age and no distress Head: Normocephalic, without obvious abnormality, atraumatic Neck: no adenopathy, no carotid bruit, no JVD, supple, symmetrical, trachea midline and thyroid not enlarged, symmetric, no tenderness/mass/nodules Resp: clear to auscultation bilaterally Cardio: regular rate and rhythm, S1, S2 normal, no murmur, click, rub or gallop GI: soft, non-tender; bowel sounds normal; no masses,  no organomegaly Extremities: extremities normal, atraumatic, no cyanosis or edema Neurologic: Alert and oriented X 3, normal strength and tone. Normal symmetric reflexes. Normal coordination and gait Skin:  No active lesions or rashes noted.    Psych:  No vegetative signs or  delusional behaviors noted.    Laboratory: From , 03/14/2011 ,  CD4 count was 400  c/cmm @ , 20, 1 %. Viral load 145  copies/ml.     Assessment/Plan:   HIV DISEASE Clinically stable on current regimen. However, in the entrance to the treatment simplification, with a twice a day to a daily, regimen, we will discontinue his Combivir and Sustiva and begin Atripla 1 tablet by mouth each bedtime. Rationale for treatment. Switch, and treatment regimen , restrictions, drug effects, side effects, ADRs, and potential toxicities were discussed in detail. He verbalized his understanding of the information provided.  Counseling provided on prevention of transmission of HIV. Condoms offered: Yes} Medication adherence discussed with patient.  Follow up visit in 6 weeks with labs 2 weeks prior to appointment. Patient acknowledged information provided to them and agreed with plan of care.      Sheriden Archibeque A. Sundra Aland, MS, [facility] [phone]  09/09/2011, 8:09 AM

## 2011-10-02 ENCOUNTER — Other Ambulatory Visit: Payer: Self-pay | Admitting: *Deleted

## 2011-10-02 DIAGNOSIS — B20 Human immunodeficiency virus [HIV] disease: Secondary | ICD-10-CM

## 2011-10-02 MED ORDER — EFAVIRENZ-EMTRICITAB-TENOFOVIR 600-200-300 MG PO TABS: 1.0000 | ORAL_TABLET | Freq: Every day | ORAL | Status: DC

## 2012-01-07 ENCOUNTER — Telehealth: Payer: Self-pay | Admitting: *Deleted

## 2012-01-07 NOTE — Telephone Encounter (Signed)
He is overdue for an appt. His phone is disconnected. Form done for referral to bridge counselling

## 2012-01-20 ENCOUNTER — Encounter: Payer: Medicare Other | Admitting: Internal Medicine

## 2012-02-17 ENCOUNTER — Encounter: Payer: Self-pay | Admitting: Internal Medicine

## 2012-02-17 ENCOUNTER — Ambulatory Visit (INDEPENDENT_AMBULATORY_CARE_PROVIDER_SITE_OTHER): Payer: Medicare Other | Admitting: Internal Medicine

## 2012-02-17 VITALS — BP 152/76 | HR 62 | Temp 97.6°F | Ht 66.0 in | Wt 108.4 lb

## 2012-02-17 DIAGNOSIS — B20 Human immunodeficiency virus [HIV] disease: Secondary | ICD-10-CM | POA: Diagnosis not present

## 2012-02-17 DIAGNOSIS — F528 Other sexual dysfunction not due to a substance or known physiological condition: Secondary | ICD-10-CM

## 2012-02-17 DIAGNOSIS — M436 Torticollis: Secondary | ICD-10-CM

## 2012-02-17 LAB — CBC
HCT: 40.4 % (ref 39.0–52.0)
MCV: 81.5 fL (ref 78.0–100.0)
Platelets: 188 10*3/uL (ref 150–400)
RBC: 4.96 MIL/uL (ref 4.22–5.81)
WBC: 4.2 10*3/uL (ref 4.0–10.5)

## 2012-02-17 MED ORDER — SILDENAFIL CITRATE 100 MG PO TABS: 100.0000 mg | ORAL_TABLET | ORAL | Status: DC | PRN

## 2012-02-17 MED ORDER — EFAVIRENZ-EMTRICITAB-TENOFOVIR 600-200-300 MG PO TABS: 1.0000 | ORAL_TABLET | Freq: Every day | ORAL | Status: DC

## 2012-02-17 MED ORDER — ENSURE PO LIQD: 1.0000 | Freq: Two times a day (BID) | ORAL | Status: DC

## 2012-02-17 NOTE — Progress Notes (Signed)
Subjective:   Patient ID: Eric Ford male   DOB: 03/08/1956 56 y.o.   MRN: [id number]  HPI: 56 year old man with past medical history significant for HIV with last CD4 count of 420 back in October of 2012, hepatitis C comes to the clinic for a followup visit.  He denies any complaints per se but reports occasional neck stiffness on some days-states that it has been going on for a few months. Denies any joint pain or swelling, blurred vision, jaw claudication.   He states that he is compliant with his medications- has been taking Atripla daily.  He is requesting a refill for Viagra   Past Medical History  Diagnosis Date  . HIV infection   . Substance abuse    Current Outpatient Prescriptions  Medication Sig Dispense Refill  . efavirenz (SUSTIVA) 600 MG tablet Take 1 tablet (600 mg total) by mouth at bedtime.  30 tablet  3  . efavirenz-emtrictabine-tenofovir (ATRIPLA) 600-200-300 MG per tablet Take 1 tablet by mouth at bedtime.  30 tablet  5  . ENSURE (ENSURE) Take 1 Can by mouth 2 (two) times daily between meals.  237 mL  12  . lamiVUDine-zidovudine (COMBIVIR) 150-300 MG per tablet Take 1 tablet by mouth 2 (two) times daily.  60 tablet  3  . pantoprazole (PROTONIX) 40 MG tablet       . peg 3350 powder (MOVIPREP) 100 G SOLR MOVI PREP take as directed  1 kit  0   No family history on file. History   Social History  . Marital Status: Single    Spouse Name: N/A    Number of Children: N/A  . Years of Education: N/A   Social History Main Topics  . Smoking status: Former Smoker    Quit date: 01/11/2009  . Smokeless tobacco: Never Used  . Alcohol Use: No     very seldom  . Drug Use: 7 per week    Special: Marijuana  . Sexually Active: Yes     pt. given condoms   Other Topics Concern  . None   Social History Narrative  . None   Review of Systems: Constitutional: Denies fever, chills, diaphoresis, appetite change and fatigue.  HEENT: Denies photophobia, eye pain,  redness, hearing loss, ear pain, congestion, sore throat, rhinorrhea, sneezing, mouth sores, trouble swallowing, neck pain, neck stiffness and tinnitus.   Respiratory: Denies SOB, DOE, cough, chest tightness,  and wheezing.   Cardiovascular: Denies chest pain, palpitations and leg swelling.  Gastrointestinal: Denies nausea, vomiting, abdominal pain, diarrhea, constipation, blood in stool and abdominal distention.  Genitourinary: Denies dysuria, urgency, frequency, hematuria, flank pain and difficulty urinating.  Musculoskeletal: Denies myalgias, back pain, joint swelling, arthralgias and gait problem.  Skin: Denies pallor, rash and wound.  Neurological: Denies dizziness, seizures, syncope, weakness, light-headedness, numbness and headaches.  Hematological: Denies adenopathy. Easy bruising, personal or family bleeding history  Psychiatric/Behavioral: Denies suicidal ideation, mood changes, confusion, nervousness, sleep disturbance and agitation  Objective:  Physical Exam: Filed Vitals:   02/17/12 1514  BP: 152/76  Pulse: 62  Temp: 97.6 F (36.4 C)  TempSrc: Oral  Height: 5\' 6"  (1.676 m)  Weight: 108 lb 6.4 oz (49.17 kg)  SpO2: 98%   Constitutional: Vital signs reviewed.  Patient is a well-developed and well-nourished  in no acute distress and cooperative with exam. Alert and oriented x3.  Head: Normocephalic and atraumatic Ear: TM normal bilaterally Mouth: no erythema or exudates, MMM Eyes: PERRL, EOMI, conjunctivae normal, No scleral icterus.  Neck: Supple, Trachea midline normal ROM, No JVD, mass, thyromegaly, or carotid bruit present.  Cardiovascular: RRR, S1 normal, S2 normal, no MRG, pulses symmetric and intact bilaterally Pulmonary/Chest: CTAB, no wheezes, rales, or rhonchi Abdominal: Soft. Non-tender, non-distended, bowel sounds are normal, no masses, organomegaly, or guarding present.  GU: no CVA tenderness Musculoskeletal: No joint deformities, erythema, or stiffness, ROM  full and no nontender Hematology: no cervical, inginal, or axillary adenopathy.  Neurological: A&O x3, Strength is normal and symmetric bilaterally, cranial nerve II-XII are grossly intact, no focal motor deficit, sensory intact to light touch bilaterally.  Skin: Warm, dry and intact. No rash, cyanosis, or clubbing.  Psychiatric: Normal mood and affect. speech and behavior is normal. Judgment and thought content normal. Cognition and memory are normal.   Assessment & Plan:

## 2012-02-17 NOTE — Patient Instructions (Addendum)
Please schedule a follow up appointment in 3 months . Please bring your medication bottles with your next appointment. Please take your medicines as prescribed. I will call you with your lab results if anything will be abnormal. 

## 2012-02-18 LAB — COMPLETE METABOLIC PANEL WITH GFR
ALT: 25 U/L (ref 0–53)
CO2: 28 mEq/L (ref 19–32)
Calcium: 8.4 mg/dL (ref 8.4–10.5)
Chloride: 107 mEq/L (ref 96–112)
Creat: 0.8 mg/dL (ref 0.50–1.35)
GFR, Est African American: >89 mL/min

## 2012-02-18 LAB — T-HELPER CELLS (CD4) COUNT (NOT AT ARMC): CD4 T Cell Abs: 540 uL (ref 400–2700)

## 2012-02-20 NOTE — Assessment & Plan Note (Signed)
Refill for Viagra was given.

## 2012-02-20 NOTE — Assessment & Plan Note (Signed)
Stable. Claims medication compliance. -Reheck his CD4 count and viral load today. -Continue Atripla

## 2012-08-13 DIAGNOSIS — Z23 Encounter for immunization: Secondary | ICD-10-CM | POA: Diagnosis not present

## 2012-09-10 ENCOUNTER — Other Ambulatory Visit: Payer: Self-pay | Admitting: Licensed Clinical Social Worker

## 2012-09-10 DIAGNOSIS — B20 Human immunodeficiency virus [HIV] disease: Secondary | ICD-10-CM

## 2012-09-10 MED ORDER — ENSURE PO LIQD: 1.0000 | Freq: Two times a day (BID) | ORAL | Status: DC

## 2012-09-28 ENCOUNTER — Other Ambulatory Visit: Payer: Self-pay | Admitting: Internal Medicine

## 2012-10-12 ENCOUNTER — Ambulatory Visit (INDEPENDENT_AMBULATORY_CARE_PROVIDER_SITE_OTHER): Payer: Medicare Other | Admitting: Internal Medicine

## 2012-10-12 ENCOUNTER — Encounter: Payer: Self-pay | Admitting: Internal Medicine

## 2012-10-12 VITALS — BP 150/82 | HR 60 | Temp 97.8°F | Ht 66.0 in | Wt 115.2 lb

## 2012-10-12 DIAGNOSIS — I10 Essential (primary) hypertension: Secondary | ICD-10-CM | POA: Insufficient documentation

## 2012-10-12 DIAGNOSIS — B20 Human immunodeficiency virus [HIV] disease: Secondary | ICD-10-CM | POA: Diagnosis not present

## 2012-10-12 MED ORDER — HYDROCHLOROTHIAZIDE 12.5 MG PO CAPS: 12.5000 mg | ORAL_CAPSULE | Freq: Every day | ORAL | Status: DC

## 2012-10-12 MED ORDER — EFAVIRENZ-EMTRICITAB-TENOFOVIR 600-200-300 MG PO TABS: 1.0000 | ORAL_TABLET | Freq: Every day | ORAL | Status: DC

## 2012-10-12 NOTE — Patient Instructions (Signed)
We will refill your atripla for 1 month and then you need to come back for a BP check and to get the results of your labs. You may need to go back to [facility] across the street.   We will start a medicine called hydrochlorothiazide (HCTZ) for your blood pressure and want you to take 1 pill per day. You will need labs in one month.   Take over the counter melatonin to help you sleep. Take 1 pill about 30 minutes to 1 hour before you want to go to sleep. Stop drinking ice tea and coffee after 4 PM.   Come back in 1 month. Insomnia Insomnia is frequent trouble falling and/or staying asleep. Insomnia can be a long term problem or a short term problem. Both are common. Insomnia can be a short term problem when the wakefulness is related to a certain stress or worry. Long term insomnia is often related to ongoing stress during waking hours and/or poor sleeping habits. Overtime, sleep deprivation itself can make the problem worse. Every little thing feels more severe because you are overtired and your ability to cope is decreased. CAUSES   Stress, anxiety, and depression.  Poor sleeping habits.  Distractions such as TV in the bedroom.  Naps close to bedtime.  Engaging in emotionally charged conversations before bed.  Technical reading before sleep.  Alcohol and other sedatives. They may make the problem worse. They can hurt normal sleep patterns and normal dream activity.  Stimulants such as caffeine for several hours prior to bedtime.  Pain syndromes and shortness of breath can cause insomnia.  Exercise late at night.  Changing time zones may cause sleeping problems (jet lag). It is sometimes helpful to have someone observe your sleeping patterns. They should look for periods of not breathing during the night (sleep apnea). They should also look to see how long those periods last. If you live alone or observers are uncertain, you can also be observed at a sleep clinic where your sleep patterns  will be professionally monitored. Sleep apnea requires a checkup and treatment. Give your caregivers your medical history. Give your caregivers observations your family has made about your sleep.  SYMPTOMS   Not feeling rested in the morning.  Anxiety and restlessness at bedtime.  Difficulty falling and staying asleep. TREATMENT   Your caregiver may prescribe treatment for an underlying medical disorders. Your caregiver can give advice or help if you are using alcohol or other drugs for self-medication. Treatment of underlying problems will usually eliminate insomnia problems.  Medications can be prescribed for short time use. They are generally not recommended for lengthy use.  Over-the-counter sleep medicines are not recommended for lengthy use. They can be habit forming.  You can promote easier sleeping by making lifestyle changes such as:  Using relaxation techniques that help with breathing and reduce muscle tension.  Exercising earlier in the day.  Changing your diet and the time of your last meal. No night time snacks.  Establish a regular time to go to bed.  Counseling can help with stressful problems and worry.  Soothing music and white noise may be helpful if there are background noises you cannot remove.  Stop tedious detailed work at least one hour before bedtime. HOME CARE INSTRUCTIONS   Keep a diary. Inform your caregiver about your progress. This includes any medication side effects. See your caregiver regularly. Take note of:  Times when you are asleep.  Times when you are awake during the night.  The quality of your sleep.  How you feel the next day. This information will help your caregiver care for you.  Get out of bed if you are still awake after 15 minutes. Read or do some quiet activity. Keep the lights down. Wait until you feel sleepy and go back to bed.  Keep regular sleeping and waking hours. Avoid naps.  Exercise regularly.  Avoid  distractions at bedtime. Distractions include watching television or engaging in any intense or detailed activity like attempting to balance the household checkbook.  Develop a bedtime ritual. Keep a familiar routine of bathing, brushing your teeth, climbing into bed at the same time each night, listening to soothing music. Routines increase the success of falling to sleep faster.  Use relaxation techniques. This can be using breathing and muscle tension release routines. It can also include visualizing peaceful scenes. You can also help control troubling or intruding thoughts by keeping your mind occupied with boring or repetitive thoughts like the old concept of counting sheep. You can make it more creative like imagining planting one beautiful flower after another in your backyard garden.  During your day, work to eliminate stress. When this is not possible use some of the previous suggestions to help reduce the anxiety that accompanies stressful situations. MAKE SURE YOU:   Understand these instructions.  Will watch your condition.  Will get help right away if you are not doing well or get worse. Document Released: 07/19/2000 Document Revised: 10/14/2011 Document Reviewed: 08/19/2007 [facility] Patient Information 2013 [place], [place].

## 2012-10-12 NOTE — Assessment & Plan Note (Signed)
BP Readings from Last 3 Encounters:  10/12/12 150/82  02/17/12 152/76  05/23/11 148/85    Lab Results  Component Value Date   NA 141 02/17/2012   K 3.8 02/17/2012   CREATININE 0.80 02/17/2012    Assessment:  Blood pressure control: mildly elevated  Progress toward BP goal:  unable to assess  Comments:   Plan:  Medications:  started HCTZ 12.5 mg daily today  Educational resources provided: brochure  Self management tools provided:    Other plans:

## 2012-10-12 NOTE — Progress Notes (Signed)
Subjective:     Patient ID: Eric Ford, male   DOB: Aug 29, 1955, 57 y.o.   MRN: [id number]  HPI The patient is a 57 year old male who comes in today for routine followup visit. He states that he needs some his medication refill. He has past medical history of HIV, history of tobacco use (quit in 2010), THC abuse. He is not having any particular complaints. He states that he does have difficulty falling asleep. Sometimes it takes him several hours to fall asleep. He states that he does have a "lady friend" who drinks a lot of coffee and he has been having coffee with her in the evenings. He does need medication refill and has not been to [facility] in several years. He states he did get a flu shot this year. He is not feeling ill. He is not having chest pain, shortness of breath, nausea, vomiting. He states that his appetite is poor and has been for some time. He does drink Ensure daily. He has been maintaining his weight.  Review of Systems  Constitutional: Negative for fever, chills, diaphoresis, activity change, appetite change, fatigue and unexpected weight change.  HENT: Negative.   Eyes: Negative.   Respiratory: Negative for cough, chest tightness, shortness of breath and wheezing.   Cardiovascular: Negative for chest pain, palpitations and leg swelling.  Gastrointestinal: Negative for nausea, vomiting, abdominal pain, diarrhea and constipation.  Endocrine: Negative.   Genitourinary: Negative.   Musculoskeletal: Negative.   Skin: Negative.   Allergic/Immunologic: Negative.   Neurological: Negative.  Negative for dizziness, tremors, seizures, syncope, facial asymmetry, speech difficulty, weakness, light-headedness, numbness and headaches.  Hematological: Negative.   Psychiatric/Behavioral: Negative.        Objective:   Physical Exam  Constitutional: He is oriented to person, place, and time. He appears well-developed. No distress.  thin  HENT:  Head: Normocephalic and atraumatic.   Eyes: EOM are normal. Pupils are equal, round, and reactive to light.  Neck: Normal range of motion. Neck supple. No JVD present. No thyromegaly present.  Cardiovascular: Normal rate and regular rhythm.   No murmur heard. Pulmonary/Chest: Effort normal and breath sounds normal. No respiratory distress. He has no wheezes. He has no rales.  Abdominal: Soft. Bowel sounds are normal. He exhibits no distension. There is no tenderness. There is no rebound.  Musculoskeletal: Normal range of motion. He exhibits no edema and no tenderness.  Neurological: He is alert and oriented to person, place, and time. No cranial nerve deficit.  Skin: Skin is warm and dry. He is not diaphoretic.  Psychiatric: He has a normal mood and affect. His behavior is normal.       Assessment/Plan:   1. New diagnosis of hypertension-the patient does have at least 3 blood pressures that are elevated and will diagnose him with hypertension at today's visit. We'll start HCTZ and followup on basic metabolic panel and recheck blood pressure in 4 weeks.  2. HIV-patient was advised to go back across the Street and see Dr. Luciana Axe. Did check viral load and CD4 count at today's visit.  3. Disposition-patient will be seen back in 4 weeks. Advised use of melatonin over-the-counter for sleep. Advised cessation of coffee and tea after 4 PM. Did start HCTZ 12 mg daily.

## 2012-10-12 NOTE — Assessment & Plan Note (Signed)
Check viral load and CD4 count at today's visit. Advised to go to see [facility] again. May need to remind at next visit.

## 2012-10-13 LAB — HIV-1 RNA QUANT-NO REFLEX-BLD: HIV-1 RNA Quant, Log: <1.3 {Log} (ref <1.30)

## 2012-10-29 ENCOUNTER — Other Ambulatory Visit: Payer: Self-pay | Admitting: Internal Medicine

## 2012-11-16 ENCOUNTER — Ambulatory Visit (INDEPENDENT_AMBULATORY_CARE_PROVIDER_SITE_OTHER): Payer: Medicare Other | Admitting: Radiation Oncology

## 2012-11-16 ENCOUNTER — Encounter: Payer: Self-pay | Admitting: Radiation Oncology

## 2012-11-16 VITALS — BP 138/86 | HR 85 | Temp 97.2°F | Ht 66.0 in | Wt 109.7 lb

## 2012-11-16 DIAGNOSIS — B20 Human immunodeficiency virus [HIV] disease: Secondary | ICD-10-CM

## 2012-11-16 DIAGNOSIS — I10 Essential (primary) hypertension: Secondary | ICD-10-CM

## 2012-11-16 LAB — BASIC METABOLIC PANEL
BUN: 22 mg/dL (ref 6–23)
CO2: 26 mEq/L (ref 19–32)
Chloride: 100 mEq/L (ref 96–112)
Glucose, Bld: 98 mg/dL (ref 70–99)
Potassium: 4.5 mEq/L (ref 3.5–5.3)

## 2012-11-16 MED ORDER — ACCU-CHEK SOFT TOUCH LANCETS MISC: Status: DC

## 2012-11-16 MED ORDER — EFAVIRENZ-EMTRICITAB-TENOFOVIR 600-200-300 MG PO TABS: 1.0000 | ORAL_TABLET | Freq: Every day | ORAL | Status: DC

## 2012-11-16 NOTE — Progress Notes (Signed)
Case discussed with Dr. McTyre at the time of the visit. We reviewed the resident's history and exam and pertinent patient test results. I agree with the assessment, diagnosis and plan of care documented in the resident's note. 

## 2012-11-16 NOTE — Patient Instructions (Addendum)
Continue taking your medications as prescribed. We will contact you with your lab results if they are abnormal, as this might indicate a need for a change in your medications.   Have a great day.

## 2012-11-16 NOTE — Assessment & Plan Note (Signed)
BP Readings from Last 3 Encounters:  11/16/12 138/86  10/12/12 150/82  02/17/12 152/76    Lab Results  Component Value Date   NA 141 02/17/2012   K 3.8 02/17/2012   CREATININE 0.80 02/17/2012    Assessment: Blood pressure control: controlled Progress toward BP goal:  at goal Comments:   Plan: Medications:  continue current medications (HCTZ 12.5mg  QD) Educational resources provided: brochure Self management tools provided: home blood pressure logbook Other plans: check BMET

## 2012-11-16 NOTE — Assessment & Plan Note (Signed)
Well-controlled. CD4 = 430 and viral load undetectable (10/12/2012). Patient was instructed to re-establish in the ID clinic with Dr. Luciana Axe.  - cont Tyrone Nine

## 2012-11-16 NOTE — Progress Notes (Signed)
Subjective:    Patient ID: Eric Ford, male    DOB: 05/02/1956, 57 y.o.   MRN: [id number]  HPI Patient is a 57 year old man with PMH significant for HIV, hypertension who presents to clinic for followup regarding both of these issues. The patient was started on HCTZ approximately one month ago for hypertension. He states he's been taking this medication daily since it was prescribed. He is tolerating the medication well, with no adverse effects. Regarding his HIV, the patient had been instructed on his previous visit on 10/12/2012 to reestablish care with the infectious disease clinic, however he states that he has not done this yet. He maintains that he has been fully compliant with his anti-retroviral medications (atripla).   Eric Ford has no complaints today, and states he is feeling well.  Review of Systems  All other systems reviewed and are negative.   Current Outpatient Medications: Current Outpatient Prescriptions  Medication Sig Dispense Refill  . efavirenz-emtricitabine-tenofovir (ATRIPLA) 600-200-300 MG per tablet Take 1 tablet by mouth at bedtime.  30 tablet  0  . ENSURE (ENSURE) Take 1 Can by mouth 2 (two) times daily between meals.  237 mL  12  . hydrochlorothiazide (MICROZIDE) 12.5 MG capsule Take 1 capsule (12.5 mg total) by mouth daily.  30 capsule  11   No current facility-administered medications for this visit.    Allergies: No Known Allergies   Past Medical History: Past Medical History  Diagnosis Date  . HIV infection   . Substance abuse     Past Surgical History: Past Surgical History  Procedure Laterality Date  . Trunk wound repair / closure    . Colonoscopy    . Polypectomy    . Colonoscopy      done years ago at [facility]    Family History: No family history on file.  Social History: History   Social History  . Marital Status: Single    Spouse Name: N/A    Number of Children: N/A  . Years of Education: N/A   Occupational  History  . Not on file.   Social History Main Topics  . Smoking status: Former Smoker    Quit date: 01/11/2009  . Smokeless tobacco: Never Used  . Alcohol Use: No     Comment: very seldom  . Drug Use: 7.00 per week    Special: Marijuana  . Sexually Active: Yes     Comment: pt. given condoms   Other Topics Concern  . Not on file   Social History Narrative  . No narrative on file     Vital Signs: Blood pressure 138/86, pulse 85, temperature 97.2 F (36.2 C), temperature source Oral, height 5\' 6"  (1.676 m), weight 109 lb 11.2 oz (49.76 kg), SpO2 98.00%.      Objective:   Physical Exam  Constitutional: He is oriented to person, place, and time. He appears well-developed and well-nourished. No distress.  HENT:  Head: Normocephalic and atraumatic.  Eyes: Pupils are equal, round, and reactive to light. No scleral icterus.  Neck: Normal range of motion. Neck supple. No tracheal deviation present.  Cardiovascular: Normal rate and regular rhythm.   No murmur heard. Pulmonary/Chest: Effort normal. He has no wheezes. He has no rales.  Abdominal: Soft. Bowel sounds are normal. He exhibits no distension. There is no tenderness.  Musculoskeletal: Normal range of motion. He exhibits no edema.  Neurological: He is alert and oriented to person, place, and time. No cranial nerve deficit.  Skin: Skin  is warm and dry. No erythema.  Psychiatric: He has a normal mood and affect. His behavior is normal.          Assessment & Plan:

## 2012-11-24 ENCOUNTER — Other Ambulatory Visit: Payer: Self-pay | Admitting: Licensed Clinical Social Worker

## 2012-11-24 ENCOUNTER — Encounter: Payer: Self-pay | Admitting: Internal Medicine

## 2012-11-24 ENCOUNTER — Ambulatory Visit (INDEPENDENT_AMBULATORY_CARE_PROVIDER_SITE_OTHER): Payer: Medicare Other | Admitting: Internal Medicine

## 2012-11-24 VITALS — BP 137/89 | HR 96 | Temp 98.4°F | Ht 66.0 in | Wt 110.0 lb

## 2012-11-24 DIAGNOSIS — Z8619 Personal history of other infectious and parasitic diseases: Secondary | ICD-10-CM

## 2012-11-24 DIAGNOSIS — B20 Human immunodeficiency virus [HIV] disease: Secondary | ICD-10-CM | POA: Diagnosis not present

## 2012-11-24 DIAGNOSIS — B1920 Unspecified viral hepatitis C without hepatic coma: Secondary | ICD-10-CM

## 2012-11-24 DIAGNOSIS — B1710 Acute hepatitis C without hepatic coma: Secondary | ICD-10-CM

## 2012-11-24 LAB — CBC WITH DIFFERENTIAL/PLATELET
Basophils Absolute: 0.1 10*3/uL (ref 0.0–0.1)
Basophils Relative: 1 % (ref 0–1)
Eosinophils Relative: 1 % (ref 0–5)
Lymphocytes Relative: 39 % (ref 12–46)
MCHC: 35.3 g/dL (ref 30.0–36.0)
MCV: 79.6 fL (ref 78.0–100.0)
Platelets: 201 10*3/uL (ref 150–400)
RDW: 14.7 % (ref 11.5–15.5)
WBC: 4.7 10*3/uL (ref 4.0–10.5)

## 2012-11-24 NOTE — Assessment & Plan Note (Signed)
He does have active hepatitis C. I will check his genotype and have him referred to the hepatology clinic.

## 2012-11-24 NOTE — Progress Notes (Signed)
  Subjective:    Patient ID: Eric Ford, male    DOB: Oct 06, 1955, 57 y.o.   MRN: [id number]  HPI He comes in for follow up of his HIV. I last saw him about 1-1/2 years ago for routine followup and he was doing well on Atripla. He has continued to take Atripla and was recently seen in the internal medicine clinic where they did check his lab tests and he has a good CD4 count of over 400 and undetectable viral load. He denies any missed doses and feels well overall. He has no particular concerns. He also has hepatitis C with an active viral load about 2 years ago.  It was has no concerns today. He does have fluctuating a lot weight and is trying to keep at least 10 more pounds on. He does eat well. He does not see a dentist.   Review of Systems  Constitutional: Negative for fever, chills, appetite change, fatigue and unexpected weight change.  HENT: Negative for sore throat and trouble swallowing.   Respiratory: Negative for cough and shortness of breath.   Cardiovascular: Negative for chest pain.  Gastrointestinal: Negative for nausea, abdominal pain and diarrhea.  Musculoskeletal: Negative for myalgias, joint swelling and arthralgias.  Skin: Negative for rash.  Neurological: Negative for dizziness, light-headedness and headaches.       Objective:   Physical Exam  Constitutional: He appears well-developed and well-nourished. No distress.  HENT:  Mouth/Throat: No oropharyngeal exudate.  Poor dentition  Cardiovascular: Normal rate, regular rhythm and normal heart sounds.  Exam reveals no gallop and no friction rub.   No murmur heard. Pulmonary/Chest: Effort normal and breath sounds normal. No respiratory distress. He has no wheezes. He has no rales.  Lymphadenopathy:    He has no cervical adenopathy.          Assessment & Plan:

## 2012-11-24 NOTE — Assessment & Plan Note (Signed)
I will check his hepatitis serology to see if he has active hepatitis B.  I also will check to see if he is hepatitis A immune

## 2012-11-24 NOTE — Assessment & Plan Note (Signed)
He is doing well despite sporadic followup. He will continue with Atripla. Return to clinic in 4 months.

## 2012-11-25 LAB — HEPATITIS B CORE ANTIBODY, TOTAL: Hep B Core Total Ab: POSITIVE — AB

## 2012-11-25 LAB — HEPATITIS A ANTIBODY, TOTAL: Hep A Total Ab: POSITIVE — AB

## 2012-11-25 LAB — COMPLETE METABOLIC PANEL WITH GFR
AST: 35 U/L (ref 0–37)
Albumin: 3.8 g/dL (ref 3.5–5.2)
BUN: 22 mg/dL (ref 6–23)
Calcium: 8.9 mg/dL (ref 8.4–10.5)
Chloride: 101 mEq/L (ref 96–112)
GFR, Est Non African American: 88 mL/min
Glucose, Bld: 125 mg/dL — ABNORMAL HIGH (ref 70–99)
Potassium: 4.2 mEq/L (ref 3.5–5.3)

## 2012-12-25 ENCOUNTER — Other Ambulatory Visit: Payer: Self-pay | Admitting: Radiation Oncology

## 2013-01-20 DIAGNOSIS — B182 Chronic viral hepatitis C: Secondary | ICD-10-CM | POA: Diagnosis not present

## 2013-01-20 DIAGNOSIS — B20 Human immunodeficiency virus [HIV] disease: Secondary | ICD-10-CM | POA: Diagnosis not present

## 2013-01-21 ENCOUNTER — Other Ambulatory Visit: Payer: Self-pay | Admitting: Internal Medicine

## 2013-01-21 DIAGNOSIS — C220 Liver cell carcinoma: Secondary | ICD-10-CM

## 2013-01-25 ENCOUNTER — Other Ambulatory Visit: Payer: Self-pay | Admitting: Internal Medicine

## 2013-01-25 NOTE — Telephone Encounter (Signed)
To F/U in Aug with [facility]. Will refill until then

## 2013-01-29 ENCOUNTER — Ambulatory Visit
Admission: RE | Admit: 2013-01-29 | Discharge: 2013-01-29 | Disposition: A | Discharge status: Home / Self Care | Payer: Medicare Other | Source: Ambulatory Visit | Attending: Internal Medicine | Admitting: Internal Medicine

## 2013-01-29 DIAGNOSIS — B182 Chronic viral hepatitis C: Secondary | ICD-10-CM | POA: Diagnosis not present

## 2013-01-29 DIAGNOSIS — C220 Liver cell carcinoma: Secondary | ICD-10-CM

## 2013-02-25 DIAGNOSIS — B182 Chronic viral hepatitis C: Secondary | ICD-10-CM | POA: Diagnosis not present

## 2013-03-16 ENCOUNTER — Other Ambulatory Visit (INDEPENDENT_AMBULATORY_CARE_PROVIDER_SITE_OTHER): Payer: Medicare Other

## 2013-03-16 DIAGNOSIS — B20 Human immunodeficiency virus [HIV] disease: Secondary | ICD-10-CM

## 2013-03-16 LAB — COMPLETE METABOLIC PANEL WITH GFR
ALT: 24 U/L (ref 0–53)
CO2: 28 mEq/L (ref 19–32)
Calcium: 8.8 mg/dL (ref 8.4–10.5)
Chloride: 106 mEq/L (ref 96–112)
Creat: 0.96 mg/dL (ref 0.50–1.35)
GFR, Est African American: >89 mL/min
Glucose, Bld: 105 mg/dL — ABNORMAL HIGH (ref 70–99)

## 2013-03-16 LAB — CBC WITH DIFFERENTIAL/PLATELET
HCT: 38.2 % — ABNORMAL LOW (ref 39.0–52.0)
Hemoglobin: 13.2 g/dL (ref 13.0–17.0)
Lymphocytes Relative: 48 % — ABNORMAL HIGH (ref 12–46)
MCHC: 34.6 g/dL (ref 30.0–36.0)
MCV: 81.8 fL (ref 78.0–100.0)
Monocytes Absolute: 0.4 10*3/uL (ref 0.1–1.0)
Monocytes Relative: 11 % (ref 3–12)
Neutro Abs: 1.4 10*3/uL — ABNORMAL LOW (ref 1.7–7.7)
WBC: 3.6 10*3/uL — ABNORMAL LOW (ref 4.0–10.5)

## 2013-03-17 LAB — T-HELPER CELL (CD4) - (RCID CLINIC ONLY): CD4 % Helper T Cell: 21 % — ABNORMAL LOW (ref 33–55)

## 2013-03-17 LAB — HIV-1 RNA QUANT-NO REFLEX-BLD: HIV-1 RNA Quant, Log: <1.3 {Log} (ref <1.30)

## 2013-03-30 ENCOUNTER — Encounter: Payer: Self-pay | Admitting: Internal Medicine

## 2013-03-30 ENCOUNTER — Ambulatory Visit (INDEPENDENT_AMBULATORY_CARE_PROVIDER_SITE_OTHER): Payer: Medicare Other | Admitting: Internal Medicine

## 2013-03-30 VITALS — BP 120/74 | HR 56 | Temp 98.0°F | Wt 105.0 lb

## 2013-03-30 DIAGNOSIS — B1710 Acute hepatitis C without hepatic coma: Secondary | ICD-10-CM | POA: Diagnosis not present

## 2013-03-30 DIAGNOSIS — Z23 Encounter for immunization: Secondary | ICD-10-CM

## 2013-03-30 DIAGNOSIS — B20 Human immunodeficiency virus [HIV] disease: Secondary | ICD-10-CM | POA: Diagnosis not present

## 2013-04-02 NOTE — Assessment & Plan Note (Signed)
Doing well, no changes, RTC 4 months

## 2013-04-02 NOTE — Assessment & Plan Note (Signed)
No treatment planned.

## 2013-04-02 NOTE — Progress Notes (Signed)
  Subjective:    Patient ID: Eric Ford, male    DOB: Sep 19, 1955, 57 y.o.   MRN: [id number]  HPI Here for routine follow up.  Continues on Atripla and viral load again undetectable.  Denies any complaints.  Feels well.  Saw hepatitis clinic though and no treatment planned.  Denies missed doses.  No significant weight loss, trying to keep weight on.  No diarrhea.     Review of Systems  Constitutional: Negative for fever, activity change, appetite change and fatigue.  HENT: Negative for sore throat and trouble swallowing.   Eyes: Negative for visual disturbance.  Respiratory: Negative for shortness of breath.   Cardiovascular: Negative for leg swelling.  Gastrointestinal: Negative for nausea, abdominal pain and diarrhea.  Musculoskeletal: Negative for myalgias and arthralgias.  Skin: Negative for rash.  Neurological: Negative for dizziness, light-headedness and headaches.  Hematological: Negative for adenopathy.  Psychiatric/Behavioral: Negative for dysphoric mood.       Objective:   Physical Exam  Constitutional: He appears well-developed and well-nourished. No distress.  HENT:  Mouth/Throat: No oropharyngeal exudate.  Eyes: Right eye exhibits no discharge. Left eye exhibits no discharge. No scleral icterus.  Cardiovascular: Normal rate, regular rhythm and normal heart sounds.   No murmur heard. Pulmonary/Chest: Effort normal and breath sounds normal. No respiratory distress. He has no wheezes.  Lymphadenopathy:    He has no cervical adenopathy.  Skin: Skin is warm and dry. No rash noted.  Psychiatric: He has a normal mood and affect. His behavior is normal.          Assessment & Plan:

## 2013-04-23 ENCOUNTER — Other Ambulatory Visit: Payer: Self-pay | Admitting: Internal Medicine

## 2013-05-05 ENCOUNTER — Other Ambulatory Visit: Payer: Self-pay | Admitting: *Deleted

## 2013-05-05 DIAGNOSIS — B20 Human immunodeficiency virus [HIV] disease: Secondary | ICD-10-CM

## 2013-05-05 MED ORDER — ENSURE PO LIQD: 1.0000 | Freq: Two times a day (BID) | ORAL | Status: DC

## 2013-05-24 ENCOUNTER — Other Ambulatory Visit: Payer: Self-pay | Admitting: Internal Medicine

## 2013-05-27 ENCOUNTER — Encounter: Payer: Self-pay | Admitting: *Deleted

## 2013-06-09 DIAGNOSIS — B182 Chronic viral hepatitis C: Secondary | ICD-10-CM | POA: Diagnosis not present

## 2013-06-21 ENCOUNTER — Other Ambulatory Visit: Payer: Self-pay | Admitting: Internal Medicine

## 2013-06-22 ENCOUNTER — Other Ambulatory Visit: Payer: Self-pay | Admitting: Internal Medicine

## 2013-06-22 MED ORDER — EFAVIRENZ-EMTRICITAB-TENOFOVIR 600-200-300 MG PO TABS: 1.0000 | ORAL_TABLET | Freq: Every day | ORAL | Status: DC

## 2013-07-27 ENCOUNTER — Other Ambulatory Visit: Payer: Medicare Other

## 2013-07-27 DIAGNOSIS — B20 Human immunodeficiency virus [HIV] disease: Secondary | ICD-10-CM

## 2013-07-28 LAB — T-HELPER CELL (CD4) - (RCID CLINIC ONLY)
CD4 % Helper T Cell: 24 % — ABNORMAL LOW (ref 33–55)
CD4 T Cell Abs: 380 /uL — ABNORMAL LOW (ref 400–2700)

## 2013-07-30 LAB — HIV-1 RNA QUANT-NO REFLEX-BLD: HIV 1 RNA Quant: <20 copies/mL (ref <20)

## 2013-08-09 DIAGNOSIS — Z23 Encounter for immunization: Secondary | ICD-10-CM | POA: Diagnosis not present

## 2013-08-10 ENCOUNTER — Ambulatory Visit (INDEPENDENT_AMBULATORY_CARE_PROVIDER_SITE_OTHER): Payer: Medicare Other | Admitting: Internal Medicine

## 2013-08-10 ENCOUNTER — Encounter: Payer: Self-pay | Admitting: Internal Medicine

## 2013-08-10 VITALS — BP 131/84 | HR 84 | Temp 97.6°F | Ht 66.0 in | Wt 99.0 lb

## 2013-08-10 DIAGNOSIS — B1710 Acute hepatitis C without hepatic coma: Secondary | ICD-10-CM | POA: Diagnosis not present

## 2013-08-10 DIAGNOSIS — B20 Human immunodeficiency virus [HIV] disease: Secondary | ICD-10-CM | POA: Diagnosis not present

## 2013-08-10 NOTE — Progress Notes (Signed)
  Subjective:    Patient ID: Eric Ford, male    DOB: 07-16-1956, 58 y.o.   MRN: [id number]  HPI  Here for routine follow up.  Continues on Atripla and viral load again undetectable.  Denies any complaints.  Feels well.  Saw hepatitis clinic and started on Harvoni 3 days ago.  Feels well.  Denies missed doses.  No significant weight loss, trying to keep weight on.  No diarrhea.  Takes Ensure still.  Has poor dentition.    Review of Systems  Constitutional: Negative for fever, activity change, appetite change and fatigue.  HENT: Negative for sore throat and trouble swallowing.   Eyes: Negative for visual disturbance.  Respiratory: Negative for shortness of breath.   Cardiovascular: Negative for leg swelling.  Gastrointestinal: Negative for nausea, abdominal pain and diarrhea.  Musculoskeletal: Negative for arthralgias and myalgias.  Skin: Negative for rash.  Neurological: Negative for dizziness, light-headedness and headaches.  Hematological: Negative for adenopathy.  Psychiatric/Behavioral: Negative for dysphoric mood.       Objective:   Physical Exam  Constitutional: He appears well-developed and well-nourished. No distress.  HENT:  Mouth/Throat: No oropharyngeal exudate.  Eyes: Right eye exhibits no discharge. Left eye exhibits no discharge. No scleral icterus.  Cardiovascular: Normal rate, regular rhythm and normal heart sounds.   No murmur heard. Pulmonary/Chest: Effort normal and breath sounds normal. No respiratory distress. He has no wheezes.  Lymphadenopathy:    He has no cervical adenopathy.  Skin: Skin is warm and dry. No rash noted.  Psychiatric: He has a normal mood and affect. His behavior is normal.          Assessment & Plan:

## 2013-08-10 NOTE — Assessment & Plan Note (Signed)
Labs today with continue undetectable vl, CD 4 of 380.  RTC 3-4 months.

## 2013-08-10 NOTE — Assessment & Plan Note (Signed)
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  Reminded him of the importance of strict adherence.

## 2013-10-08 ENCOUNTER — Other Ambulatory Visit: Payer: Self-pay | Admitting: *Deleted

## 2013-10-08 ENCOUNTER — Other Ambulatory Visit: Payer: Self-pay | Admitting: Licensed Clinical Social Worker

## 2013-10-08 DIAGNOSIS — B20 Human immunodeficiency virus [HIV] disease: Secondary | ICD-10-CM

## 2013-10-08 DIAGNOSIS — B182 Chronic viral hepatitis C: Secondary | ICD-10-CM | POA: Diagnosis not present

## 2013-10-08 MED ORDER — ENSURE PO LIQD: 1.0000 | Freq: Two times a day (BID) | ORAL | Status: DC

## 2013-10-19 ENCOUNTER — Other Ambulatory Visit: Payer: Self-pay | Admitting: Internal Medicine

## 2013-10-19 DIAGNOSIS — I10 Essential (primary) hypertension: Secondary | ICD-10-CM

## 2013-10-19 NOTE — Telephone Encounter (Signed)
Message sent to front office to schedule pt an appt. 

## 2013-10-25 ENCOUNTER — Ambulatory Visit (INDEPENDENT_AMBULATORY_CARE_PROVIDER_SITE_OTHER): Payer: Medicare Other | Admitting: Internal Medicine

## 2013-10-25 ENCOUNTER — Encounter: Payer: Self-pay | Admitting: Internal Medicine

## 2013-10-25 ENCOUNTER — Encounter: Payer: Self-pay | Admitting: Gastroenterology

## 2013-10-25 VITALS — BP 119/74 | HR 68 | Temp 97.1°F | Ht 66.0 in | Wt 109.9 lb

## 2013-10-25 DIAGNOSIS — B20 Human immunodeficiency virus [HIV] disease: Secondary | ICD-10-CM

## 2013-10-25 DIAGNOSIS — B1920 Unspecified viral hepatitis C without hepatic coma: Secondary | ICD-10-CM

## 2013-10-25 DIAGNOSIS — N529 Male erectile dysfunction, unspecified: Secondary | ICD-10-CM | POA: Diagnosis not present

## 2013-10-25 DIAGNOSIS — I10 Essential (primary) hypertension: Secondary | ICD-10-CM

## 2013-10-25 DIAGNOSIS — Z0000 Encounter for general adult medical examination without abnormal findings: Secondary | ICD-10-CM

## 2013-10-25 DIAGNOSIS — B1710 Acute hepatitis C without hepatic coma: Secondary | ICD-10-CM

## 2013-10-25 DIAGNOSIS — F528 Other sexual dysfunction not due to a substance or known physiological condition: Secondary | ICD-10-CM

## 2013-10-25 MED ORDER — SILDENAFIL CITRATE 100 MG PO TABS: 100.0000 mg | ORAL_TABLET | ORAL | Status: DC | PRN

## 2013-10-25 NOTE — Assessment & Plan Note (Addendum)
Pt UTD.  However, he is due for repeat colonoscopy in May 2015.   -does not need a referral at this time; he is on the recall list and should receive a letter from GI; instructed him to call for an appointment as soon as he receives this letter

## 2013-10-25 NOTE — Progress Notes (Signed)
Case discussed with Dr. Gill at the time of the visit.  We reviewed the resident's history and exam and pertinent patient test results.  I agree with the assessment, diagnosis and plan of care documented in the resident's note. 

## 2013-10-25 NOTE — Progress Notes (Signed)
Subjective:   Patient ID: Eric Ford male    DOB: August 19, 1955 58 y.o.    MRN: [id number] Health Maintenance Due: No health maintenance topics applied.  ________________________________________________________________  HPI: Mr.Ab Eric Ford is a 58 y.o. male here for a routine visit.  Pt has a PMH outlined below.  Please see problem-based charting assessment and plan note for further details of medical issues addressed at today's visit.  PMH: Past Medical History  Diagnosis Date  . HIV infection   . Substance abuse     Medications: Current Outpatient Prescriptions on File Prior to Visit  Medication Sig Dispense Refill  . efavirenz-emtricitabine-tenofovir (ATRIPLA) 600-200-300 MG per tablet Take 1 tablet by mouth at bedtime.  30 tablet  5  . ENSURE (ENSURE) Take 1 Can by mouth 2 (two) times daily between meals.  237 mL  12  . hydrochlorothiazide (MICROZIDE) 12.5 MG capsule Take 1 capsule (12.5 mg total) by mouth daily.  30 capsule  2  . Ledipasvir-Sofosbuvir (HARVONI) 90-400 MG TABS Take 1 tablet by mouth daily.       No current facility-administered medications on file prior to visit.    Allergies: No Known Allergies  FH: No family history on file.  SH: History   Social History  . Marital Status: Single    Spouse Name: N/A    Number of Children: N/A  . Years of Education: N/A   Social History Main Topics  . Smoking status: Former Smoker    Quit date: 01/11/2009  . Smokeless tobacco: Never Used  . Alcohol Use: No     Comment: very seldom  . Drug Use: 7.00 per week    Special: Marijuana  . Sexual Activity: Yes     Comment: pt. given condoms   Other Topics Concern  . None   Social History Narrative  . None    Review of Systems: Constitutional: Negative for fever, chills and recent weight loss.  Eyes: Negative for blurred vision.  Respiratory: Negative for cough and shortness of breath.  Cardiovascular: Negative for chest pain, palpitations and  leg swelling.  Gastrointestinal: Negative for nausea, vomiting, abdominal pain, diarrhea, constipation and blood in stool.  Genitourinary: Negative for dysuria, urgency and frequency.  Musculoskeletal: Negative for myalgias and back pain.  Neurological: Negative for dizziness, weakness and headaches.     Objective:   Vital Signs: Filed Vitals:   10/25/13 0925  BP: 119/74  Pulse: 68  Temp: 97.1 F (36.2 C)  TempSrc: Oral  Height: '5\' 6"'  (1.676 m)  Weight: 109 lb 14.4 oz (49.85 kg)  SpO2: 100%      BP Readings from Last 3 Encounters:  10/25/13 119/74  08/10/13 131/84  03/30/13 120/74    Physical Exam: Constitutional: Vital signs reviewed.  Patient is chronically ill appearing and cachetic but cooperative with exam.  Head: Normocephalic and atraumatic. Eyes: PERRL, EOMI, conjunctivae nl, no scleral icterus.  Neck: Supple. Cardiovascular: RRR, no MRG. Pulmonary/Chest: normal effort, non-tender to palpation, CTAB, no wheezes, rales, or rhonchi. Abdominal: Thin. Soft. NT/ND +BS. Neurological: A&O x3, cranial nerves II-XII are grossly intact, moving all extremities. Extremities: 2+DP b/l; no pitting edema. Skin: Warm, dry and intact. No rash.  Most Recent Laboratory Results:  CMP     Component Value Date/Time   NA 139 03/16/2013 1124   K 4.1 03/16/2013 1124   CL 106 03/16/2013 1124   CO2 28 03/16/2013 1124   GLUCOSE 105* 03/16/2013 1124   BUN 13 03/16/2013 1124  CREATININE 0.96 03/16/2013 1124   CREATININE 0.58 01/04/2011 1707   CALCIUM 8.8 03/16/2013 1124   PROT 7.5 03/16/2013 1124   ALBUMIN 3.7 03/16/2013 1124   AST 35 03/16/2013 1124   ALT 24 03/16/2013 1124   ALKPHOS 77 03/16/2013 1124   BILITOT 0.4 03/16/2013 1124   GFRNONAA >60 01/04/2011 1707   GFRAA  Value: >60        The eGFR has been calculated using the MDRD equation. This calculation has not been validated in all clinical situations. eGFR's persistently <60 mL/min signify possible Chronic Kidney Disease. 01/04/2011 1707     CBC    Component Value Date/Time   WBC 3.6* 03/16/2013 1124   RBC 4.67 03/16/2013 1124   HGB 13.2 03/16/2013 1124   HCT 38.2* 03/16/2013 1124   PLT 209 03/16/2013 1124   MCV 81.8 03/16/2013 1124   MCH 28.3 03/16/2013 1124   MCHC 34.6 03/16/2013 1124   RDW 14.9 03/16/2013 1124   LYMPHSABS 1.7 03/16/2013 1124   MONOABS 0.4 03/16/2013 1124   EOSABS 0.1 03/16/2013 1124   BASOSABS 0.1 03/16/2013 1124    Lipid Panel Lab Results  Component Value Date   CHOL 181 11/01/2010   HDL 48 11/01/2010   LDLCALC 93 11/01/2010   TRIG 201* 11/01/2010   CHOLHDL 3.8 11/01/2010    HA1C No results found for this basename: HGBA1C    Urinalysis    Component Value Date/Time   COLORURINE YELLOW 05/09/2011 [place] 05/09/2011 0949   LABSPEC 1.022 05/09/2011 0949   PHURINE 6.0 05/09/2011 0949   GLUCOSEU NEG 05/09/2011 0949   HGBUR NEG 05/09/2011 0949   BILIRUBINUR NEG 05/09/2011 0949   KETONESUR NEG 05/09/2011 0949   PROTEINUR NEG 05/09/2011 0949   UROBILINOGEN 0.2 05/09/2011 0949   NITRITE NEG 05/09/2011 0949   LEUKOCYTESUR NEG 05/09/2011 0949    Urine Microalbumin Lab Results  Component Value Date   MICROALBUR 0.94 05/09/2011    Imaging N/A   Assessment & Plan:   Assessment and plan was discussed and formulated with my attending.  Patient should return to the [facility] in 1 year.

## 2013-10-25 NOTE — Assessment & Plan Note (Signed)
Pt requesting refill of viagra. -refilled viagra

## 2013-10-25 NOTE — Assessment & Plan Note (Signed)
BP Readings from Last 3 Encounters:  10/25/13 119/74  08/10/13 131/84  03/30/13 120/74    Lab Results  Component Value Date   NA 139 03/16/2013   K 4.1 03/16/2013   CREATININE 0.96 03/16/2013    Assessment: Blood pressure control: controlled Progress toward BP goal:  at goal  Plan: Medications:  continue current medications; continue HCTZ 12.5mg  QD

## 2013-10-25 NOTE — Patient Instructions (Signed)
Thank you for your visit today. Please return to the internal medicine clinic in 1 year or sooner if needed.    Your current medical regimen is effective;  continue present plan and all medications. Please be sure to bring all of your medications with you to every visit.  Should you have any new or worsening symptoms, please be sure to call the clinic at [phone].  If you believe that you are suffering from a life threatening condition or one that may result in the loss of limb or function, then you should call 911 or proceed to the nearest Emergency Department.

## 2013-10-25 NOTE — Assessment & Plan Note (Signed)
Well controlled; last visit with ID in 1/15.  Pt weight is up 10 lbs from January and continues to drink Ensure twice daily.  -continue to follow up with ID

## 2013-10-25 NOTE — Assessment & Plan Note (Addendum)
Currently on treatment and followed by [facility] liver care.   -continue Harvoni

## 2013-11-01 ENCOUNTER — Other Ambulatory Visit: Payer: Medicare Other

## 2013-11-15 ENCOUNTER — Other Ambulatory Visit (HOSPITAL_COMMUNITY)
Admission: RE | Admit: 2013-11-15 | Discharge: 2013-11-15 | Disposition: A | Discharge status: Home / Self Care | Payer: Medicare Other | Source: Ambulatory Visit | Attending: Internal Medicine | Admitting: Internal Medicine

## 2013-11-15 ENCOUNTER — Encounter: Payer: Self-pay | Admitting: Internal Medicine

## 2013-11-15 ENCOUNTER — Ambulatory Visit (INDEPENDENT_AMBULATORY_CARE_PROVIDER_SITE_OTHER): Payer: Medicare Other | Admitting: Internal Medicine

## 2013-11-15 VITALS — BP 145/86 | HR 79 | Temp 97.8°F | Ht 66.0 in | Wt 106.0 lb

## 2013-11-15 DIAGNOSIS — Z113 Encounter for screening for infections with a predominantly sexual mode of transmission: Secondary | ICD-10-CM | POA: Insufficient documentation

## 2013-11-15 DIAGNOSIS — B1920 Unspecified viral hepatitis C without hepatic coma: Secondary | ICD-10-CM

## 2013-11-15 DIAGNOSIS — E785 Hyperlipidemia, unspecified: Secondary | ICD-10-CM | POA: Diagnosis not present

## 2013-11-15 DIAGNOSIS — B20 Human immunodeficiency virus [HIV] disease: Secondary | ICD-10-CM | POA: Diagnosis not present

## 2013-11-15 DIAGNOSIS — Z79899 Other long term (current) drug therapy: Secondary | ICD-10-CM | POA: Diagnosis not present

## 2013-11-15 DIAGNOSIS — I10 Essential (primary) hypertension: Secondary | ICD-10-CM | POA: Diagnosis not present

## 2013-11-15 DIAGNOSIS — R636 Underweight: Secondary | ICD-10-CM | POA: Diagnosis not present

## 2013-11-15 DIAGNOSIS — Z9189 Other specified personal risk factors, not elsewhere classified: Secondary | ICD-10-CM

## 2013-11-15 DIAGNOSIS — N529 Male erectile dysfunction, unspecified: Secondary | ICD-10-CM | POA: Diagnosis not present

## 2013-11-15 DIAGNOSIS — Z800 Family history of malignant neoplasm of digestive organs: Secondary | ICD-10-CM

## 2013-11-15 DIAGNOSIS — B1710 Acute hepatitis C without hepatic coma: Secondary | ICD-10-CM | POA: Diagnosis not present

## 2013-11-15 HISTORY — DX: Underweight: R63.6

## 2013-11-15 LAB — CBC WITH DIFFERENTIAL/PLATELET
Basophils Absolute: 0.1 10*3/uL (ref 0.0–0.1)
Basophils Relative: 1 % (ref 0–1)
EOS ABS: 0.1 10*3/uL (ref 0.0–0.7)
EOS PCT: 1 % (ref 0–5)
HEMATOCRIT: 39.9 % (ref 39.0–52.0)
Hemoglobin: 13.5 g/dL (ref 13.0–17.0)
Lymphocytes Relative: 44 % (ref 12–46)
Lymphs Abs: 2.9 10*3/uL (ref 0.7–4.0)
MCH: 28.8 pg (ref 26.0–34.0)
MCHC: 33.8 g/dL (ref 30.0–36.0)
MCV: 85.3 fL (ref 78.0–100.0)
MONO ABS: 0.7 10*3/uL (ref 0.1–1.0)
Monocytes Relative: 10 % (ref 3–12)
Neutro Abs: 2.9 10*3/uL (ref 1.7–7.7)
Neutrophils Relative %: 44 % (ref 43–77)
Platelets: 254 10*3/uL (ref 150–400)
RBC: 4.68 MIL/uL (ref 4.22–5.81)
RDW: 14.6 % (ref 11.5–15.5)
WBC: 6.6 10*3/uL (ref 4.0–10.5)

## 2013-11-15 LAB — COMPLETE METABOLIC PANEL WITH GFR
ALBUMIN: 3.9 g/dL (ref 3.5–5.2)
ALT: 22 U/L (ref 0–53)
AST: 25 U/L (ref 0–37)
Alkaline Phosphatase: 92 U/L (ref 39–117)
BUN: 18 mg/dL (ref 6–23)
CO2: 24 mEq/L (ref 19–32)
Calcium: 9.2 mg/dL (ref 8.4–10.5)
Chloride: 100 mEq/L (ref 96–112)
Creat: 0.96 mg/dL (ref 0.50–1.35)
GFR, Est African American: >89 mL/min
GFR, Est Non African American: 86 mL/min
Glucose, Bld: 62 mg/dL — ABNORMAL LOW (ref 70–99)
POTASSIUM: 4.9 meq/L (ref 3.5–5.3)
Sodium: 132 mEq/L — ABNORMAL LOW (ref 135–145)
TOTAL PROTEIN: 7.9 g/dL (ref 6.0–8.3)
Total Bilirubin: 0.2 mg/dL (ref 0.2–1.2)

## 2013-11-15 LAB — LIPID PANEL
CHOL/HDL RATIO: 5 ratio
Cholesterol: 250 mg/dL — ABNORMAL HIGH (ref 0–200)
HDL: 50 mg/dL (ref >39)
LDL CALC: 153 mg/dL — AB (ref 0–99)
Triglycerides: 236 mg/dL — ABNORMAL HIGH (ref <150)
VLDL: 47 mg/dL — ABNORMAL HIGH (ref 0–40)

## 2013-11-15 NOTE — Progress Notes (Signed)
  Subjective:    Patient ID: Eric Ford, male    DOB: 1955-09-22, 58 y.o.   MRN: [id number]  HPI  Here for routine follow up.  Continues on Atripla with no recent labs.  Denies any complaints.  Feels well.  Saw hepatitis clinic and has now completed Harvoni 12 weeks.  Feels well.  Denies missed doses of either.  No significant weight loss, trying to keep weight on.  No diarrhea.  Takes Ensure still.  Has poor dentition. Unable to afford Viagra.    According to him, he has not been back to the hepatology clinic since completion of Harvoni and does not remember having any recent labs from them.     Review of Systems  Constitutional: Negative for fever, activity change, appetite change and fatigue.  HENT: Negative for sore throat and trouble swallowing.   Eyes: Negative for visual disturbance.  Respiratory: Negative for shortness of breath.   Cardiovascular: Negative for leg swelling.  Gastrointestinal: Negative for nausea, abdominal pain and diarrhea.  Musculoskeletal: Negative for arthralgias and myalgias.  Skin: Negative for rash.  Neurological: Negative for dizziness, light-headedness and headaches.  Hematological: Negative for adenopathy.  Psychiatric/Behavioral: Negative for dysphoric mood.       Objective:   Physical Exam  Constitutional: He appears well-developed and well-nourished. No distress.  HENT:  Mouth/Throat: No oropharyngeal exudate.  Eyes: Right eye exhibits no discharge. Left eye exhibits no discharge. No scleral icterus.  Cardiovascular: Normal rate, regular rhythm and normal heart sounds.   No murmur heard. Pulmonary/Chest: Effort normal and breath sounds normal. No respiratory distress. He has no wheezes.  Lymphadenopathy:    He has no cervical adenopathy.  Skin: Skin is warm and dry. No rash noted.  Psychiatric: He has a normal mood and affect. His behavior is normal.          Assessment & Plan:

## 2013-11-15 NOTE — Assessment & Plan Note (Signed)
Labs today. RTC 4 months unless there are concerns.

## 2013-11-15 NOTE — Assessment & Plan Note (Signed)
He has gained weight.  Still BMI 17.  Will continue with Ensure for now.

## 2013-11-15 NOTE — Assessment & Plan Note (Signed)
Completed treatment.  I will check his HCV viral load to monitor for cure and also follow creatinine. I can also continue the routine monitoring of his viral load at 24 weeks to assure cure, unless done by hepatology clinic.

## 2013-11-15 NOTE — Assessment & Plan Note (Signed)
Due for repeat colonoscopy.  Will refer back to [facility].

## 2013-11-16 LAB — URINE CYTOLOGY ANCILLARY ONLY
CHLAMYDIA, DNA PROBE: NEGATIVE
Neisseria Gonorrhea: NEGATIVE

## 2013-11-16 LAB — T-HELPER CELL (CD4) - (RCID CLINIC ONLY)
CD4 % Helper T Cell: 24 % — ABNORMAL LOW (ref 33–55)
CD4 T Cell Abs: 750 /uL (ref 400–2700)

## 2013-11-16 LAB — RPR

## 2013-11-17 LAB — HEPATITIS C RNA QUANTITATIVE: HCV Quantitative: NOT DETECTED IU/mL (ref <15)

## 2013-11-17 LAB — HIV-1 RNA QUANT-NO REFLEX-BLD

## 2013-11-30 DIAGNOSIS — B182 Chronic viral hepatitis C: Secondary | ICD-10-CM | POA: Diagnosis not present

## 2013-12-01 ENCOUNTER — Other Ambulatory Visit: Payer: Self-pay | Admitting: Nurse Practitioner

## 2013-12-01 DIAGNOSIS — C220 Liver cell carcinoma: Secondary | ICD-10-CM

## 2013-12-07 ENCOUNTER — Ambulatory Visit
Admission: RE | Admit: 2013-12-07 | Discharge: 2013-12-07 | Disposition: A | Discharge status: Home / Self Care | Payer: Medicare Other | Source: Ambulatory Visit | Attending: Nurse Practitioner | Admitting: Nurse Practitioner

## 2013-12-07 DIAGNOSIS — C220 Liver cell carcinoma: Secondary | ICD-10-CM

## 2013-12-07 DIAGNOSIS — B1920 Unspecified viral hepatitis C without hepatic coma: Secondary | ICD-10-CM | POA: Diagnosis not present

## 2013-12-14 ENCOUNTER — Other Ambulatory Visit: Payer: Self-pay | Admitting: Internal Medicine

## 2013-12-16 ENCOUNTER — Encounter: Payer: Self-pay | Admitting: Internal Medicine

## 2014-01-11 ENCOUNTER — Other Ambulatory Visit: Payer: Self-pay | Admitting: Internal Medicine

## 2014-01-17 ENCOUNTER — Encounter: Payer: Self-pay | Admitting: Internal Medicine

## 2014-01-17 ENCOUNTER — Ambulatory Visit (INDEPENDENT_AMBULATORY_CARE_PROVIDER_SITE_OTHER): Payer: Medicare Other | Admitting: Internal Medicine

## 2014-01-17 VITALS — BP 125/81 | HR 79 | Temp 97.0°F | Ht 67.0 in | Wt 105.4 lb

## 2014-01-17 DIAGNOSIS — B20 Human immunodeficiency virus [HIV] disease: Secondary | ICD-10-CM

## 2014-01-17 DIAGNOSIS — I10 Essential (primary) hypertension: Secondary | ICD-10-CM

## 2014-01-17 DIAGNOSIS — I70219 Atherosclerosis of native arteries of extremities with intermittent claudication, unspecified extremity: Secondary | ICD-10-CM

## 2014-01-17 DIAGNOSIS — I739 Peripheral vascular disease, unspecified: Secondary | ICD-10-CM

## 2014-01-17 LAB — PROTIME-INR
INR: 1.03 (ref <1.50)
PROTHROMBIN TIME: 13.3 s (ref 11.6–15.2)

## 2014-01-17 LAB — COMPLETE METABOLIC PANEL WITH GFR
ALT: 26 U/L (ref 0–53)
AST: 32 U/L (ref 0–37)
Albumin: 3.9 g/dL (ref 3.5–5.2)
Alkaline Phosphatase: 108 U/L (ref 39–117)
BUN: 18 mg/dL (ref 6–23)
CHLORIDE: 97 meq/L (ref 96–112)
CO2: 26 meq/L (ref 19–32)
Calcium: 9.5 mg/dL (ref 8.4–10.5)
Creat: 0.98 mg/dL (ref 0.50–1.35)
GFR, Est African American: >89 mL/min
GFR, Est Non African American: 84 mL/min
Glucose, Bld: 101 mg/dL — ABNORMAL HIGH (ref 70–99)
Potassium: 4.3 mEq/L (ref 3.5–5.3)
Sodium: 135 mEq/L (ref 135–145)
TOTAL PROTEIN: 8.8 g/dL — AB (ref 6.0–8.3)
Total Bilirubin: <0.2 mg/dL — ABNORMAL LOW (ref 0.2–1.2)

## 2014-01-17 LAB — CBC WITH DIFFERENTIAL/PLATELET
Basophils Absolute: 0.1 10*3/uL (ref 0.0–0.1)
Basophils Relative: 1 % (ref 0–1)
EOS PCT: 1 % (ref 0–5)
Eosinophils Absolute: 0.1 10*3/uL (ref 0.0–0.7)
HEMATOCRIT: 41.9 % (ref 39.0–52.0)
Hemoglobin: 14.1 g/dL (ref 13.0–17.0)
LYMPHS PCT: 45 % (ref 12–46)
Lymphs Abs: 2.5 10*3/uL (ref 0.7–4.0)
MCH: 29.3 pg (ref 26.0–34.0)
MCHC: 33.7 g/dL (ref 30.0–36.0)
MCV: 86.9 fL (ref 78.0–100.0)
MONO ABS: 0.5 10*3/uL (ref 0.1–1.0)
Monocytes Relative: 9 % (ref 3–12)
Neutro Abs: 2.5 10*3/uL (ref 1.7–7.7)
Neutrophils Relative %: 44 % (ref 43–77)
Platelets: 272 10*3/uL (ref 150–400)
RBC: 4.82 MIL/uL (ref 4.22–5.81)
RDW: 14.3 % (ref 11.5–15.5)
WBC: 5.6 10*3/uL (ref 4.0–10.5)

## 2014-01-17 NOTE — Assessment & Plan Note (Signed)
Well controlled.  Plans: Continue current regimen. 

## 2014-01-17 NOTE — Progress Notes (Signed)
Subjective:   Patient ID: Eric Ford male   DOB: 16-Apr-1956 58 y.o.   MRN: [id number]  HPI: [name] is a 58 y.o. gentleman with PMH significant for HTN, Hepatitic C (successfully cured), HIV disease with CD4 count of 750 and <20 copies comes to the office with CC of left calf muscle pain on ambulation x 1 month.  Patient reports that he noticed left calf muscle pain upon ambulation about a month ago, insidious in onset. Patient denies any pain while resting but pain starts after he walks for "a block". Patient reports that he usually stops walking and takes some rest and starts walking again. He reports that he is not able to walk more than a block without taking rest. He reports that he was cutting his lawn over the weekend and he had to take several breaks because of the pain. He states that he is concerned about the symptoms and wanted to be seen.  Patient denies any chest pain, SOB, severe left leg pain, left leg feeling cold, skin changes/ nail changes in left leg. He denies any trauma or muscle strain. He denies using any OTC medications. He denies any similar symptoms in the past or similar symptoms on the opposite leg. He denies any swelling of the left leg, DVT in the legs, or H/O PE.  He denies any other complaints.  Past Medical History  Diagnosis Date  . HIV infection   . Substance abuse    Current Outpatient Prescriptions  Medication Sig Dispense Refill  . ATRIPLA 600-200-300 MG per tablet TAKE 1 TABLET BY MOUTH AT BEDTIME  30 tablet  4  . ENSURE (ENSURE) Take 1 Can by mouth 2 (two) times daily between meals.  237 mL  12  . hydrochlorothiazide (MICROZIDE) 12.5 MG capsule TAKE ONE CAPSULE BY MOUTH DAILY  30 capsule  3  . Ledipasvir-Sofosbuvir (HARVONI) 90-400 MG TABS Take 1 tablet by mouth daily.      . sildenafil (VIAGRA) 100 MG tablet Take 1 tablet (100 mg total) by mouth as needed for erectile dysfunction.  8 tablet  3   No current facility-administered  medications for this visit.   No family history on file. History   Social History  . Marital Status: Single    Spouse Name: N/A    Number of Children: N/A  . Years of Education: N/A   Social History Main Topics  . Smoking status: Former Smoker    Quit date: 01/11/2009  . Smokeless tobacco: Never Used  . Alcohol Use: No     Comment: very seldom  . Drug Use: 7.00 per week    Special: Marijuana  . Sexual Activity: Yes    Partners: Female     Comment: pt. given condoms   Other Topics Concern  . None   Social History Narrative  . None   Review of Systems: Pertinent items are noted in HPI. Objective:  Physical Exam: Filed Vitals:   01/17/14 1005  BP: 125/81  Pulse: 79  Temp: 97 F (36.1 C)  TempSrc: Oral  Height: 5\' 7"  (1.702 m)  Weight: 105 lb 6.4 oz (47.809 kg)  SpO2: 99%   Constitutional: Vital signs reviewed.  Patient is a well-developed and well-nourished and is in no acute distress and cooperative with exam. Alert and oriented x3.  Mouth:Poor dentition and several missing teeth. Eyes: Conjunctivae normal, No scleral icterus.  Neck: Supple, No carotid bruit present.  Cardiovascular: RRR, S1 normal, S2 normal, no MRG.  Pulmonary/Chest: normal respiratory effort, CTAB, no wheezes, rales, or rhonchi Abdominal: Soft, non-distended, non-tender. BS+ Musculoskeletal: Left LE: On inspection, there is no swelling, erythema or induration involving the left lower leg and calf muscle. The skin color, nails, hair distribution appear similar in both LE's. The skin feels warm, dry to touch and is similar to the right leg. There is no calf muscle tenderness. There are no signs of cellulitis. Dorsalis pedis artery pulsations are not readily palpable on palpation in the left foot. Right foot dorsalis pedis artery pulsation are full and palpable. Bedside portable handheld doppler was done both extremities. Left dorsalis pedis artery pulsations are audible, rhythmic but slightly  diminished in intensity in comparison with the right dorsalis artery. Again, left dorsalis pedis artery was not palpable upon localization of the pulses by doppler. Neurological: A&O x3, Strength is normal and symmetric bilaterally, no focal motor deficit, sensory intact to light touch bilaterally.  Skin: Warm, dry and intact.  Psychiatric: Normal mood and affect.  Assessment & Plan:

## 2014-01-17 NOTE — Patient Instructions (Signed)
Have the test performed as scheduled tomorrow. Follow up with me in the clinic tomorrow after the test. Intermittent Claudication Blockage of leg arteries results from poor circulation of blood in the leg arteries. This produces an aching, tired, and sometimes burning pain in the legs that is brought on by exercise and made better by rest. Claudication refers to the limping that happens from leg cramps. It is also referred to as Vaso-occlusive disease of the legs, arterial insufficiency of the legs, recurrent leg pain, recurrent leg cramping and calf pain with exercise.  CAUSES  This condition is due to narrowing or blockage of the arteries (muscular vessels which carry blood away from the heart and around the body). Blockage of arteries can occur anywhere in the body. If they occur in the heart, a person may experience angina (chest pain) or even a heart attack. If they occur in the neck or the brain, a person may have a stroke. Intermittent claudication is when the blockage occurs in the legs, most commonly in the calf or the foot.  Atherosclerosis, or blockage of arteries, can occur for many reasons. Some of these are smoking, diabetes, and high cholesterol. SYMPTOMS  Intermittent claudication may occur in both legs, and it often continues to get worse over time. However, some people complain only of weakness in the legs when walking, or a feeling of "tiredness" in the buttocks. Impotence (not able to have an erection) is an occasional complaint in men. Pain while resting is uncommon.  WHAT TO EXPECT AT [facility] PROVIDER'S OFFICE: Your medical history will be asked for and a physical examination will be performed. Medical history questions documenting claudication in detail may include:   Time pattern  Do you have leg cramps at night (nocturnal cramps)?  How often does leg pain with cramping occur?  Is it getting worse?  What is the quality of the pain?  Is the pain sharp?  Is  there an aching pain with the cramps?  Aggravating factors  Is it worse after you exercise?  Is it worse after you are standing for a while?  Do you smoke? How much?  Do you drink alcohol? How much?  Are you diabetic? How well is your blood sugar controlled?  Other  What other symptoms are also present?  Has there been impotence (men)?  Is there pain in the back?  Is there a darkening of the skin of the legs, feet or toes?  Is there weakness or paralysis of the legs? The physical examination may include evaluation of the femoral pulse (in the groin) and the other areas where the pulse can be felt in the legs. DIAGNOSIS  Diagnostic tests that may be performed include:  Blood pressure measured in arms and legs for comparison.  Doppler ultrasonography on the legs and the heart.  Duplex Doppler/ultrasound exam of extremity to visualize arterial blood flow.  ECG- to evaluate the activity of your heart.  Aortography- to visualize blockages in your arteries. TREATMENT Surgical treatment may be suggested if claudication interferes with the patient's activities or work, and if the diseased arteries do not seem to be improving after treatment. Be aware that this condition can worsen over time and you should carefully monitor your condition. HOME CARE INSTRUCTIONS  Talk to your caregiver about the cause of your leg cramping and about what to do at home to relieve it.  A healthy diet is important to lessen the likeliness of atherosclerosis.  A program of daily walking  for short periods, and stopping for pain or cramping, may help improve function.  It is important to stop smoking.  Avoid putting hot or cold items on legs.  Avoid tight shoes. SEEK MEDICAL CARE IF: There are many other causes of leg pain such as arthritis or low blood potassium. However, some causes of leg pain may be life threatening such as a blood clot in the legs. Seek medical attention if you have:  Leg  pain that does not go away.  Legs that may be red, hot or swollen.  Ulcers or sores appear on your ankle or foot.  Any chest pain or shortness of breath accompanying leg pain.  Diabetes.  You are pregnant. SEEK IMMEDIATE MEDICAL CARE IF:   Your leg pain becomes severe or will not go away.  Your foot turns blue or a dark color.  Your leg becomes red, hot or swollen or you develop a fever over 102F.  Any chest pain or shortness of breath accompanying leg pain. MAKE SURE YOU:   Understand these instructions.  Will watch your condition.  Will get help right away if you are not doing well or get worse. Document Released: 05/24/2004 Document Revised: 10/14/2011 Document Reviewed: 03/11/2008 [facility] Patient Information 2014 [place].

## 2014-01-17 NOTE — Assessment & Plan Note (Signed)
Patient symptoms consistent with classic intermittent claudication involving the left gastrocnemius muscle group. Supported by physical exam findings of non-palpable left dorsalis pedis artery. Suspect secondary to Peripheral arterial disease. No symptoms/signs suggestive of acute ischemic limb or cellulitis or DVT. Bedside portable handheld doppler reveals audible blood flow in the left dorsalis pedis artery. Discussed with the attending regarding further management and plan.  Plans: Schedules ABI for both legs tomorrow at 12:45 at [facility]. Will see him back in the clinic after the ABI. Risk factor control for now - HTN management, patient already quit smoking 4-5 years ago, HLD management. Will discuss rest of the management pending ABI studies.

## 2014-01-18 ENCOUNTER — Ambulatory Visit (HOSPITAL_COMMUNITY)
Admission: RE | Admit: 2014-01-18 | Discharge: 2014-01-18 | Disposition: A | Discharge status: Home / Self Care | Payer: Medicare Other | Source: Ambulatory Visit | Attending: Internal Medicine | Admitting: Internal Medicine

## 2014-01-18 ENCOUNTER — Encounter: Payer: Self-pay | Admitting: Internal Medicine

## 2014-01-18 ENCOUNTER — Ambulatory Visit (INDEPENDENT_AMBULATORY_CARE_PROVIDER_SITE_OTHER): Payer: Medicare Other | Admitting: Internal Medicine

## 2014-01-18 VITALS — BP 119/77 | HR 65 | Temp 97.7°F | Resp 20 | Ht 65.0 in | Wt 106.2 lb

## 2014-01-18 DIAGNOSIS — I10 Essential (primary) hypertension: Secondary | ICD-10-CM | POA: Diagnosis not present

## 2014-01-18 DIAGNOSIS — I739 Peripheral vascular disease, unspecified: Secondary | ICD-10-CM | POA: Diagnosis not present

## 2014-01-18 DIAGNOSIS — B20 Human immunodeficiency virus [HIV] disease: Secondary | ICD-10-CM | POA: Diagnosis not present

## 2014-01-18 DIAGNOSIS — I70219 Atherosclerosis of native arteries of extremities with intermittent claudication, unspecified extremity: Secondary | ICD-10-CM

## 2014-01-18 DIAGNOSIS — E785 Hyperlipidemia, unspecified: Secondary | ICD-10-CM

## 2014-01-18 DIAGNOSIS — M79609 Pain in unspecified limb: Secondary | ICD-10-CM | POA: Diagnosis not present

## 2014-01-18 MED ORDER — ATORVASTATIN CALCIUM 40 MG PO TABS: 40.0000 mg | ORAL_TABLET | Freq: Every day | ORAL | Status: DC

## 2014-01-18 MED ORDER — ASPIRIN EC 81 MG PO TBEC: 81.0000 mg | DELAYED_RELEASE_TABLET | Freq: Every day | ORAL | Status: AC

## 2014-01-18 NOTE — Assessment & Plan Note (Signed)
Peripheral arterial disease involving Left LE with ABI of 0.56 and occlusion of the distal femoral artery on arterial duplex, with classic claudication involving the left calf muscle group. Peripheral arterial disease involving right LE with ABI of 0.88 Discussed with the attending regarding further management.  Plans: Anti-platelet therapy with Aspirin 81 mg qd. HTN management - well controlled. HLD management - Starting Lipitor 40 mg. Already quit smoking 5 yrs ago. HIV management - well controlled. Regular Exercise - running/walking 30-45 minutes daily 3-4 a week. Defer Cilostazol therapy for now. Will consider if the symptoms are worsening or not better with exercise and conservative management as above. Vascular surgery referral to see if he would benefit from interventions. Follow up in a month.

## 2014-01-18 NOTE — Patient Instructions (Addendum)
Start taking the Aspirin 81 mg once daily. Start taking the Lipitor 40 mg once daily. Start walking or slow running on the tread mill for a minimum of 30 to 45 minutes at least three times per week for a minimum of 12 weeks.  Peripheral Vascular Disease Peripheral Vascular Disease (PVD), also called Peripheral Arterial Disease (PAD), is a circulation problem caused by cholesterol (atherosclerotic plaque) deposits in the arteries. PVD commonly occurs in the lower extremities (legs) but it can occur in other areas of the body, such as your arms. The cholesterol buildup in the arteries reduces blood flow which can cause pain and other serious problems. The presence of PVD can place a person at risk for Coronary Artery Disease (CAD).  CAUSES  Causes of PVD can be many. It is usually associated with more than one risk factor such as:   High Cholesterol.  Smoking.  Diabetes.  Lack of exercise or inactivity.  High blood pressure (hypertension).  Obesity.  Family history. SYMPTOMS   When the lower extremities are affected, patients with PVD may experience:  Leg pain with exertion or physical activity. This is called INTERMITTENT CLAUDICATION. This may present as cramping or numbness with physical activity. The location of the pain is associated with the level of blockage. For example, blockage at the abdominal level (distal abdominal aorta) may result in buttock or hip pain. Lower leg arterial blockage may result in calf pain.  As PVD becomes more severe, pain can develop with less physical activity.  In people with severe PVD, leg pain may occur at rest.  Other PVD signs and symptoms:  Leg numbness or weakness.  Coldness in the affected leg or foot, especially when compared to the other leg.  A change in leg color.  Patients with significant PVD are more prone to ulcers or sores on toes, feet or legs. These may take longer to heal or may reoccur. The ulcers or sores can become  infected.  If signs and symptoms of PVD are ignored, gangrene may occur. This can result in the loss of toes or loss of an entire limb.  Not all leg pain is related to PVD. Other medical conditions can cause leg pain such as:  Blood clots (embolism) or Deep Vein Thrombosis.  Inflammation of the blood vessels (vasculitis).  Spinal stenosis. DIAGNOSIS  Diagnosis of PVD can involve several different types of tests. These can include:  Pulse Volume Recording Method (PVR). This test is simple, painless and does not involve the use of X-rays. PVR involves measuring and comparing the blood pressure in the arms and legs. An ABI (Ankle-Brachial Index) is calculated. The normal ratio of blood pressures is 1. As this number becomes smaller, it indicates more severe disease.  < 0.95  indicates significant narrowing in one or more leg vessels.  <0.8 there will usually be pain in the foot, leg or buttock with exercise.  <0.4 will usually have pain in the legs at rest.  <0.25  usually indicates limb threatening PVD.  Doppler detection of pulses in the legs. This test is painless and checks to see if you have a pulses in your legs/feet.  A dye or contrast material (a substance that highlights the blood vessels so they show up on x-ray) may be given to help your caregiver better see the arteries for the following tests. The dye is eliminated from your body by the kidney's. Your caregiver may order blood work to check your kidney function and other laboratory values  before the following tests are performed:  Magnetic Resonance Angiography (MRA). An MRA is a picture study of the blood vessels and arteries. The MRA machine uses a large magnet to produce images of the blood vessels.  Computed Tomography Angiography (CTA). A CTA is a specialized x-ray that looks at how the blood flows in your blood vessels. An IV may be inserted into your arm so contrast dye can be injected.  Angiogram. Is a procedure that  uses x-rays to look at your blood vessels. This procedure is minimally invasive, meaning a small incision (cut) is made in your groin. A small tube (catheter) is then inserted into the artery of your groin. The catheter is guided to the blood vessel or artery your caregiver wants to examine. Contrast dye is injected into the catheter. X-rays are then taken of the blood vessel or artery. After the images are obtained, the catheter is taken out. TREATMENT  Treatment of PVD involves many interventions which may include:  Lifestyle changes:  Quitting smoking.  Exercise.  Following a low fat, low cholesterol diet.  Control of diabetes.  Foot care is very important to the PVD patient. Good foot care can help prevent infection.  Medication:  Cholesterol-lowering medicine.  Blood pressure medicine.  Anti-platelet drugs.  Certain medicines may reduce symptoms of Intermittent Claudication.  Interventional/Surgical options:  Angioplasty. An Angioplasty is a procedure that inflates a balloon in the blocked artery. This opens the blocked artery to improve blood flow.  Stent Implant. A wire mesh tube (stent) is placed in the artery. The stent expands and stays in place, allowing the artery to remain open.  Peripheral Bypass Surgery. This is a surgical procedure that reroutes the blood around a blocked artery to help improve blood flow. This type of procedure may be performed if Angioplasty or stent implants are not an option. SEEK IMMEDIATE MEDICAL CARE IF:   You develop pain or numbness in your arms or legs.  Your arm or leg turns cold, becomes blue in color.  You develop redness, warmth, swelling and pain in your arms or legs. MAKE SURE YOU:   Understand these instructions.  Will watch your condition.  Will get help right away if you are not doing well or get worse. Document Released: 08/29/2004 Document Revised: 10/14/2011 Document Reviewed: 07/26/2008 [facility] Patient  Information 2014 [place], [place].

## 2014-01-18 NOTE — Progress Notes (Addendum)
VASCULAR LAB PRELIMINARY  ARTERIAL Evidence of 50-99% stenosis involving the mid Right femoral artery. Biphasic flow to right posterior tibial artery. Evidence of occlusion of the distal Left femoral artery, with recanalized flow to left mid left popliteal artery. Dampened monophasic flow to left posterior tibial artery.  ABI:   RIGHT    LEFT    PRESSURE WAVEFORM  PRESSURE WAVEFORM  BRACHIAL 125 Tri BRACHIAL 105 Tri  DP   DP    AT 110 Bi AT 70 Damp mono  PT 103 Bi PT 66 Damp mono  PER   PER    GREAT TOE  NA GREAT TOE  NA    RIGHT LEFT  ABI 0.88 0.56    Landry Mellow, RDMS, RVT  01/18/2014, 2:00 PM

## 2014-01-18 NOTE — Assessment & Plan Note (Signed)
Well controlled.  Plans: Continue current regimen. 

## 2014-01-18 NOTE — Progress Notes (Signed)
Subjective:   Patient ID: Eric Ford male   DOB: February 28, 1956 58 y.o.   MRN: [id number]  HPI: Eric Ford is a 58 y.o. gentleman with PMH significant for HTN, Hepatitic C (successfully cured), HIV disease with CD4 count of 750 and <20 copies comes to the office for a follow up of his yesterday's office visit and further management of his symptoms.    I saw Eric Ford yesterday for symptoms of classic claudication on the left leg. Patient didn't have palpable dorsalis artery pulsations in the left foot although he did have pulse on handheld doppler. Patient was scheduled for an ABI today. I received a call from the vascular lab and his ABI was 0.88 on the right LE and 0.56 on the LLE. I ordered to do arterial LE doppler studies which revealed occlusion of the left distal femoral artery and 50-99% stenosis of the right mid femoral artery. Patient is here to discuss further treatment strategy.  He denies any other complaints.  Past Medical History  Diagnosis Date  . HIV infection   . Substance abuse    Current Outpatient Prescriptions  Medication Sig Dispense Refill  .      .      . ATRIPLA 600-200-300 MG per tablet TAKE 1 TABLET BY MOUTH AT BEDTIME  30 tablet  4  . ENSURE (ENSURE) Take 1 Can by mouth 2 (two) times daily between meals.  237 mL  12  . hydrochlorothiazide (MICROZIDE) 12.5 MG capsule TAKE ONE CAPSULE BY MOUTH DAILY  30 capsule  3  . sildenafil (VIAGRA) 100 MG tablet Take 1 tablet (100 mg total) by mouth as needed for erectile dysfunction.  8 tablet  3   No current facility-administered medications for this visit.   No family history on file. History   Social History  . Marital Status: Single    Spouse Name: N/A    Number of Children: N/A  . Years of Education: N/A   Social History Main Topics  . Smoking status: Former Smoker    Quit date: 01/11/2009  . Smokeless tobacco: Never Used  . Alcohol Use: No     Comment: very seldom  . Drug Use: 7.00 per  week    Special: Marijuana  . Sexual Activity: Yes    Partners: Female     Comment: pt. given condoms   Other Topics Concern  . None   Social History Narrative  . None   Review of Systems: Pertinent items are noted in HPI. Objective:  Physical Exam: Filed Vitals:   01/18/14 1457  BP: 119/77  Pulse: 65  Temp: 97.7 F (36.5 C)  TempSrc: Oral  Resp: 20  Height: 5\' 5"  (1.651 m)  Weight: 106 lb 3.2 oz (48.172 kg)  SpO2: 100%   Constitutional: Vital signs reviewed. Patient is a well-developed and well-nourished and is in no acute distress and cooperative with exam. Alert and oriented x3.  Mouth:Poor dentition and several missing teeth.  Eyes: Conjunctivae normal, No scleral icterus.  Neck: Supple, No carotid bruit present.  Cardiovascular: RRR, S1 normal, S2 normal, no MRG.  Pulmonary/Chest: normal respiratory effort, CTAB, no wheezes, rales, or rhonchi  Abdominal: Soft, non-distended, non-tender. BS+  Musculoskeletal:  Left LE: On inspection, there is no swelling, erythema or induration involving the left lower leg and calf muscle. The skin color, nails, hair distribution appear similar in both LE's. The skin feels warm, dry to touch and is similar to the right leg. There is  no calf muscle tenderness. There are no signs of cellulitis. Dorsalis pedis artery pulsations are not readily palpable on palpation in the left foot. Right foot dorsalis pedis artery pulsation are full and palpable. Bedside portable handheld doppler was done both extremities. Left dorsalis pedis artery pulsations are audible, rhythmic but slightly diminished in intensity in comparison with the right dorsalis artery. Again, left dorsalis pedis artery was not palpable upon localization of the pulses by doppler.  Neurological: A&O x3, Strength is normal and symmetric bilaterally, no focal motor deficit, sensory intact to light touch bilaterally.  Skin: Warm, dry and intact.  Psychiatric: Normal mood and affect.    Assessment & Plan:

## 2014-01-19 NOTE — Progress Notes (Signed)
I saw and evaluated the patient.  I personally confirmed the key portions of the history and exam documented by Dr. Eyvonne Mechanic and I reviewed pertinent patient test results.  The assessment, diagnosis, and plan were formulated together and I agree with the documentation in the resident's note.

## 2014-01-19 NOTE — Progress Notes (Signed)
Case discussed with Dr. Boggala at the time of the visit.  We reviewed the resident's history and exam and pertinent patient test results.  I agree with the assessment, diagnosis, and plan of care documented in the resident's note. 

## 2014-02-02 ENCOUNTER — Encounter: Payer: Self-pay | Admitting: Vascular Surgery

## 2014-02-03 ENCOUNTER — Ambulatory Visit (INDEPENDENT_AMBULATORY_CARE_PROVIDER_SITE_OTHER): Payer: Medicare Other | Admitting: Vascular Surgery

## 2014-02-03 ENCOUNTER — Encounter: Payer: Self-pay | Admitting: Vascular Surgery

## 2014-02-03 VITALS — BP 122/81 | HR 78 | Ht 65.0 in | Wt 105.9 lb

## 2014-02-03 DIAGNOSIS — I70219 Atherosclerosis of native arteries of extremities with intermittent claudication, unspecified extremity: Secondary | ICD-10-CM | POA: Diagnosis not present

## 2014-02-03 NOTE — Progress Notes (Signed)
[facility] [place] HISTORY AND PHYSICAL   History of Present Illness:  Patient is a 58 y.o. year old male who presents for evaluation of left leg claudication. The patient experiences left calf pain after walking one block. His symptoms resolved fairly quickly after resting. Symptoms have been present for approximately one month. He denies current tobacco use. He is a former smoker but quit in 2010. He currently is on aspirin. He has chronic medical problems including HIV and hypertension.  These are currently stable. He denies rest pain. He denies nonhealing wounds on the foot.  Past Medical History  Diagnosis Date  . HIV infection   . Substance abuse     Past Surgical History  Procedure Laterality Date  . Trunk wound repair / closure    . Colonoscopy    . Polypectomy    . Colonoscopy      done years ago at [facility] History History  Substance Use Topics  . Smoking status: Former Smoker    Quit date: 01/11/2009  . Smokeless tobacco: Never Used  . Alcohol Use: No     Comment: very seldom    Family History Family History  Problem Relation Age of Onset  . Cancer Mother   . Heart disease Mother   . Hypertension Mother   . Heart attack Mother   . Hypertension Father   . Heart attack Father   . Cancer Sister   . Diabetes Sister   . Hyperlipidemia Sister   . Hypertension Sister   . Peripheral vascular disease Sister   . AAA (abdominal aortic aneurysm) Sister   . Diabetes Brother   . Hypertension Brother   . Hypertension Son     Allergies  No Known Allergies   Current Outpatient Prescriptions  Medication Sig Dispense Refill  . aspirin EC 81 MG tablet Take 1 tablet (81 mg total) by mouth daily.  100 tablet  3  . atorvastatin (LIPITOR) 40 MG tablet Take 1 tablet (40 mg total) by mouth at bedtime.  30 tablet  11  . ATRIPLA 600-200-300 MG per tablet TAKE 1 TABLET BY MOUTH AT BEDTIME  30 tablet  4  . ENSURE (ENSURE) Take 1 Can by mouth  2 (two) times daily between meals.  237 mL  12  . hydrochlorothiazide (MICROZIDE) 12.5 MG capsule TAKE ONE CAPSULE BY MOUTH DAILY  30 capsule  3  . sildenafil (VIAGRA) 100 MG tablet Take 1 tablet (100 mg total) by mouth as needed for erectile dysfunction.  8 tablet  3   No current facility-administered medications for this visit.    ROS:   General:  No weight loss, Fever, chills  HEENT: No recent headaches, no nasal bleeding, no visual changes, no sore throat  Neurologic: No dizziness, blackouts, seizures. No recent symptoms of stroke or mini- stroke. No recent episodes of slurred speech, or temporary blindness.  Cardiac: No recent episodes of chest pain/pressure, no shortness of breath at rest.  No shortness of breath with exertion.  Denies history of atrial fibrillation or irregular heartbeat  Vascular: No history of rest pain in feet.  No history of claudication.  No history of non-healing ulcer, No history of DVT   Pulmonary: No home oxygen, no productive cough, no hemoptysis,  No asthma or wheezing  Musculoskeletal:  [ ]  Arthritis, [ ]  Low back pain,  [ ]  Joint pain  Hematologic:No history of hypercoagulable state.  No history of easy bleeding.  No  history of anemia  Gastrointestinal: No hematochezia or melena,  No gastroesophageal reflux, no trouble swallowing  Urinary: [ ]  chronic Kidney disease, [ ]  on HD - [ ]  MWF or [ ]  TTHS, [ ]  Burning with urination, [ ]  Frequent urination, [ ]  Difficulty urinating;   Skin: No rashes  Psychological: No history of anxiety,  + history of depression   Physical Examination  Filed Vitals:   02/03/14 0934  BP: 122/81  Pulse: 78  Height: 5\' 5"  (1.651 m)  Weight: 105 lb 14.4 oz (48.036 kg)  SpO2: 100%    Body mass index is 17.62 kg/(m^2).  General:  Alert and oriented, no acute distress HEENT: Normal Neck: No bruit or JVD Pulmonary: Clear to auscultation bilaterally Cardiac: Regular Rate and Rhythm without murmur Abdomen:  Soft, non-tender, non-distended, no mass, no scars Skin: No rash Extremity Pulses:  2+ radial, brachial, femoral, 2+ right dorsalis pedis, absent left dorsalis pedis pulse absent posterior tibial pulses bilaterally Musculoskeletal: No deformity or edema  Neurologic: Upper and lower extremity motor 5/5 and symmetric  DATA:  Recent ABIs are [facility] reviewed. These are dated 01/18/2014. 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 The patient will return in 3 months time to see if he has had improvement of symptoms of walking alone. He will have repeat ABIs and arterial duplex at that time to If not we would consider whether or not an intervention would be in his best interest. I reassured the patient is not currently at risk of limb loss and lifetime less than 5% chance of limb loss as long as he is able to refrain smoking  Ruta Hinds, MD [facility] [place]: [phone] Pager: [phone]

## 2014-03-14 ENCOUNTER — Encounter: Payer: Medicare Other | Admitting: Surgery

## 2014-04-14 ENCOUNTER — Other Ambulatory Visit: Payer: Self-pay | Admitting: *Deleted

## 2014-04-14 DIAGNOSIS — B20 Human immunodeficiency virus [HIV] disease: Secondary | ICD-10-CM

## 2014-04-14 MED ORDER — ENSURE PO LIQD: 1.0000 | Freq: Two times a day (BID) | ORAL | Status: DC

## 2014-05-04 ENCOUNTER — Encounter: Payer: Self-pay | Admitting: Vascular Surgery

## 2014-05-05 ENCOUNTER — Encounter (HOSPITAL_COMMUNITY): Payer: Medicare Other

## 2014-05-05 ENCOUNTER — Other Ambulatory Visit (HOSPITAL_COMMUNITY): Payer: Medicare Other

## 2014-05-05 ENCOUNTER — Ambulatory Visit: Payer: Medicare Other | Admitting: Vascular Surgery

## 2014-05-13 ENCOUNTER — Other Ambulatory Visit: Payer: Self-pay | Admitting: *Deleted

## 2014-05-13 MED ORDER — HYDROCHLOROTHIAZIDE 12.5 MG PO CAPS: ORAL_CAPSULE | ORAL | Status: DC

## 2014-05-17 DIAGNOSIS — B182 Chronic viral hepatitis C: Secondary | ICD-10-CM | POA: Diagnosis not present

## 2014-05-17 DIAGNOSIS — Z23 Encounter for immunization: Secondary | ICD-10-CM | POA: Diagnosis not present

## 2014-05-20 ENCOUNTER — Other Ambulatory Visit (HOSPITAL_COMMUNITY): Payer: Self-pay | Admitting: Nurse Practitioner

## 2014-05-20 DIAGNOSIS — K7400 Hepatic fibrosis, unspecified: Secondary | ICD-10-CM

## 2014-06-02 ENCOUNTER — Ambulatory Visit (HOSPITAL_COMMUNITY)
Admission: RE | Admit: 2014-06-02 | Discharge: 2014-06-02 | Disposition: A | Discharge status: Home / Self Care | Payer: Medicare Other | Source: Ambulatory Visit | Attending: Diagnostic Radiology | Admitting: Diagnostic Radiology

## 2014-06-02 DIAGNOSIS — K7400 Hepatic fibrosis, unspecified: Secondary | ICD-10-CM

## 2014-06-02 DIAGNOSIS — K824 Cholesterolosis of gallbladder: Secondary | ICD-10-CM | POA: Insufficient documentation

## 2014-06-13 ENCOUNTER — Other Ambulatory Visit: Payer: Self-pay | Admitting: Internal Medicine

## 2014-06-15 ENCOUNTER — Encounter: Payer: Self-pay | Admitting: Vascular Surgery

## 2014-06-16 ENCOUNTER — Ambulatory Visit (HOSPITAL_COMMUNITY)
Admission: RE | Admit: 2014-06-16 | Discharge: 2014-06-16 | Disposition: A | Discharge status: Home / Self Care | Payer: Medicare Other | Source: Ambulatory Visit | Attending: Vascular Surgery | Admitting: Vascular Surgery

## 2014-06-16 ENCOUNTER — Ambulatory Visit (INDEPENDENT_AMBULATORY_CARE_PROVIDER_SITE_OTHER): Payer: Medicare Other | Admitting: Vascular Surgery

## 2014-06-16 ENCOUNTER — Encounter: Payer: Self-pay | Admitting: Vascular Surgery

## 2014-06-16 VITALS — BP 108/70 | HR 70 | Ht 65.0 in | Wt 120.8 lb

## 2014-06-16 DIAGNOSIS — I70219 Atherosclerosis of native arteries of extremities with intermittent claudication, unspecified extremity: Secondary | ICD-10-CM

## 2014-06-16 DIAGNOSIS — F17210 Nicotine dependence, cigarettes, uncomplicated: Secondary | ICD-10-CM | POA: Insufficient documentation

## 2014-06-16 DIAGNOSIS — E785 Hyperlipidemia, unspecified: Secondary | ICD-10-CM | POA: Insufficient documentation

## 2014-06-16 DIAGNOSIS — I10 Essential (primary) hypertension: Secondary | ICD-10-CM | POA: Diagnosis not present

## 2014-06-16 NOTE — Progress Notes (Signed)
    Established Intermittent Claudication  History of Present Illness  Eric Ford is a 58 y.o. (20-Dec-1955) male who presents for three month follow up for left leg claudication. He was last seen in the office on 02/03/14. The patient's treatment regimen currently included: maximal medical management and walking program. The patient's symptoms have since improved.  Previously, he described having to stop three times in one block due to left calf pain. Today, he says he is able to walk a block without stopping. He usually walks about 8 blocks every morning. He reports quitting cigarettes 2 years ago but smokes marijuana 2-3 times daily to help with sleep.   The patient's PMH, PSH, SH, FamHx, Med, and Allergies are unchanged from 02/03/14  On ROS today: he denies any right calf pain, rest pain and non healing wounds.   Physical Examination  Filed Vitals:   06/16/14 1542  BP: 108/70  Pulse: 70  Height: 5\' 5"  (1.651 m)  Weight: 120 lb 12.8 oz (54.795 kg)  SpO2: 100%   Body mass index is 20.1 kg/(m^2).  General: A&O x 3, thin male in NAD  Pulmonary: Sym exp, good air movt, CTAB, no rales, rhonchi, & wheezing   Cardiac: RRR, Nl S1, S2, no Murmurs, rubs or gallops  Vascular: 2+ femoral pulses b/l, non palpable popliteal pulses b/l, 2+ right dorsalis pedis pulse, non palpable left dorsalis pulses, non palpable posterior tibial pulses b/l  Musculoskeletal: M/S 5/5 throughout, Extremities without ischemic changes  Neurologic: Pain and light touch intact in extremities.   Non-Invasive Vascular Imaging ABI (Date: 06/16/2014)  R: 0.98, previously 0.88 (01/18/14)  L: 0.68, previously 0.56 (01/18/14)  Medical Decision Making  Eric Ford is a 58 y.o. male who presents with:  left leg intermittent claudication without evidence of critical limb ischemia.  Based on the patient's vascular studies and examination,the patient will continue a daily walking regimen. He is satisfied with his  current walking progress and does not desire any further intervention.   Discussed the importance of regular exercise, cessation of marijuana and maximal medical management through control of hypertension, hyperlipidemia and use of anti-platelet agents.   He is currently on an aspirin and statin.  The patient is aware that without maximal medical management the underlying atherosclerotic disease process will progress. He will follow up in one year with ABIs.    Virgina Jock, PA-C [facility] [place] Office: [phone] Pager: [phone]  06/16/2014, 4:28 PM   This patient was seen in conjunction with Dr. Oneida Alar.   History and exam details as above. The patient currently has claudication but this is not lifestyle limiting and he is currently satisfied with his walking distance. His ABIs are reasonable and he is not at risk of limb loss. He will continue a walking program of 30 minutes daily and continuing his aspirin. I did discuss with him stopping smoking marijuana.  He will follow-up in 6 months time with repeat ABIs.  Ruta Hinds, MD [facility] [place] Office: [phone] Pager: [phone]

## 2014-06-16 NOTE — Addendum Note (Signed)
Addended by: Mena Goes on: 06/16/2014 05:03 PM   Modules accepted: Orders

## 2014-07-18 ENCOUNTER — Other Ambulatory Visit: Payer: Self-pay | Admitting: Internal Medicine

## 2014-07-18 DIAGNOSIS — B20 Human immunodeficiency virus [HIV] disease: Secondary | ICD-10-CM

## 2014-07-19 ENCOUNTER — Other Ambulatory Visit: Payer: Medicare Other

## 2014-07-19 DIAGNOSIS — B20 Human immunodeficiency virus [HIV] disease: Secondary | ICD-10-CM | POA: Diagnosis not present

## 2014-07-20 LAB — T-HELPER CELL (CD4) - (RCID CLINIC ONLY)
CD4 T CELL ABS: 610 /uL (ref 400–2700)
CD4 T CELL HELPER: 24 % — AB (ref 33–55)

## 2014-07-20 LAB — HIV-1 RNA QUANT-NO REFLEX-BLD: HIV 1 RNA Quant: <20 copies/mL (ref <20)

## 2014-08-15 ENCOUNTER — Encounter: Payer: Self-pay | Admitting: Internal Medicine

## 2014-08-15 ENCOUNTER — Ambulatory Visit (INDEPENDENT_AMBULATORY_CARE_PROVIDER_SITE_OTHER): Payer: Medicare Other | Admitting: Internal Medicine

## 2014-08-15 VITALS — BP 123/74 | HR 67 | Temp 98.3°F | Wt 114.0 lb

## 2014-08-15 DIAGNOSIS — B20 Human immunodeficiency virus [HIV] disease: Secondary | ICD-10-CM

## 2014-08-15 DIAGNOSIS — B182 Chronic viral hepatitis C: Secondary | ICD-10-CM

## 2014-08-15 DIAGNOSIS — Z113 Encounter for screening for infections with a predominantly sexual mode of transmission: Secondary | ICD-10-CM | POA: Diagnosis not present

## 2014-08-15 DIAGNOSIS — Z79899 Other long term (current) drug therapy: Secondary | ICD-10-CM | POA: Diagnosis not present

## 2014-08-15 NOTE — Assessment & Plan Note (Signed)
Doing great.  rtc 6 months.  

## 2014-08-15 NOTE — Assessment & Plan Note (Signed)
Will check end of treatment cure next visit.

## 2014-08-15 NOTE — Progress Notes (Signed)
  Subjective:    Patient ID: Eric Ford, male    DOB: [birth year], 59 y.o.   MRN: [id number]  HPI Here for routine follow up.  Continues on Atripla with a good CD4 and continued undetectable viral load.  Denies any complaints.  No missed doses.  Happy with atripla.     Review of Systems  Constitutional: Negative for fever, activity change, appetite change and fatigue.  HENT: Negative for sore throat and trouble swallowing.   Eyes: Negative for visual disturbance.  Respiratory: Negative for shortness of breath.   Cardiovascular: Negative for leg swelling.  Gastrointestinal: Negative for nausea, abdominal pain and diarrhea.  Musculoskeletal: Negative for myalgias and arthralgias.  Skin: Negative for rash.  Neurological: Negative for dizziness, light-headedness and headaches.  Hematological: Negative for adenopathy.  Psychiatric/Behavioral: Negative for dysphoric mood.       Objective:   Physical Exam  Constitutional: He appears well-developed and well-nourished. No distress.  HENT:  Mouth/Throat: No oropharyngeal exudate.  Eyes: No scleral icterus.  Cardiovascular: Normal rate, regular rhythm and normal heart sounds.   No murmur heard. Pulmonary/Chest: Effort normal and breath sounds normal. No respiratory distress.  Lymphadenopathy:    He has no cervical adenopathy.  Skin: No rash noted.          Assessment & Plan:

## 2014-08-20 ENCOUNTER — Other Ambulatory Visit: Payer: Self-pay | Admitting: Internal Medicine

## 2014-08-22 ENCOUNTER — Other Ambulatory Visit: Payer: Self-pay | Admitting: *Deleted

## 2014-08-22 NOTE — Telephone Encounter (Signed)
[employer] currently covering the costs for his Part D.  RN left the pt know that he could call [facility] if [facility] starts charging for co-pays for his medications.  He would be eligible for SPAP.

## 2014-09-19 ENCOUNTER — Other Ambulatory Visit: Payer: Self-pay | Admitting: Internal Medicine

## 2014-10-07 ENCOUNTER — Encounter: Payer: Self-pay | Admitting: Gastroenterology

## 2014-10-20 DIAGNOSIS — H5203 Hypermetropia, bilateral: Secondary | ICD-10-CM | POA: Diagnosis not present

## 2014-10-20 DIAGNOSIS — H5310 Unspecified subjective visual disturbances: Secondary | ICD-10-CM | POA: Diagnosis not present

## 2014-10-20 DIAGNOSIS — H524 Presbyopia: Secondary | ICD-10-CM | POA: Diagnosis not present

## 2014-12-09 ENCOUNTER — Other Ambulatory Visit: Payer: Self-pay | Admitting: *Deleted

## 2014-12-09 DIAGNOSIS — B20 Human immunodeficiency virus [HIV] disease: Secondary | ICD-10-CM

## 2014-12-09 MED ORDER — ENSURE PO LIQD: 1.0000 | Freq: Two times a day (BID) | ORAL | Status: DC

## 2015-01-16 ENCOUNTER — Other Ambulatory Visit: Payer: Self-pay | Admitting: *Deleted

## 2015-01-17 MED ORDER — HYDROCHLOROTHIAZIDE 12.5 MG PO CAPS: 12.5000 mg | ORAL_CAPSULE | Freq: Every day | ORAL | Status: DC

## 2015-02-12 ENCOUNTER — Encounter: Payer: Self-pay | Admitting: Internal Medicine

## 2015-02-13 ENCOUNTER — Encounter: Payer: Self-pay | Admitting: Internal Medicine

## 2015-02-13 ENCOUNTER — Ambulatory Visit (INDEPENDENT_AMBULATORY_CARE_PROVIDER_SITE_OTHER): Payer: Medicare Other | Admitting: Internal Medicine

## 2015-02-13 VITALS — BP 127/84 | HR 75 | Temp 98.2°F | Ht 65.0 in | Wt 107.2 lb

## 2015-02-13 DIAGNOSIS — Z0000 Encounter for general adult medical examination without abnormal findings: Secondary | ICD-10-CM

## 2015-02-13 DIAGNOSIS — I10 Essential (primary) hypertension: Secondary | ICD-10-CM

## 2015-02-13 DIAGNOSIS — B1920 Unspecified viral hepatitis C without hepatic coma: Secondary | ICD-10-CM

## 2015-02-13 DIAGNOSIS — I739 Peripheral vascular disease, unspecified: Secondary | ICD-10-CM

## 2015-02-13 DIAGNOSIS — D126 Benign neoplasm of colon, unspecified: Secondary | ICD-10-CM | POA: Diagnosis not present

## 2015-02-13 DIAGNOSIS — R636 Underweight: Secondary | ICD-10-CM

## 2015-02-13 DIAGNOSIS — B182 Chronic viral hepatitis C: Secondary | ICD-10-CM

## 2015-02-13 DIAGNOSIS — F1220 Cannabis dependence, uncomplicated: Secondary | ICD-10-CM | POA: Diagnosis not present

## 2015-02-13 DIAGNOSIS — F1210 Cannabis abuse, uncomplicated: Secondary | ICD-10-CM

## 2015-02-13 DIAGNOSIS — Z7982 Long term (current) use of aspirin: Secondary | ICD-10-CM | POA: Diagnosis not present

## 2015-02-13 DIAGNOSIS — Z79899 Other long term (current) drug therapy: Secondary | ICD-10-CM

## 2015-02-13 DIAGNOSIS — Z21 Asymptomatic human immunodeficiency virus [HIV] infection status: Secondary | ICD-10-CM

## 2015-02-13 DIAGNOSIS — Z681 Body mass index (BMI) 19 or less, adult: Secondary | ICD-10-CM | POA: Diagnosis not present

## 2015-02-13 DIAGNOSIS — B20 Human immunodeficiency virus [HIV] disease: Secondary | ICD-10-CM

## 2015-02-13 DIAGNOSIS — E785 Hyperlipidemia, unspecified: Secondary | ICD-10-CM | POA: Diagnosis not present

## 2015-02-13 LAB — CBC WITH DIFFERENTIAL/PLATELET
BASOS PCT: 1 % (ref 0–1)
Basophils Absolute: 0.1 10*3/uL (ref 0.0–0.1)
EOS ABS: 0.1 10*3/uL (ref 0.0–0.7)
Eosinophils Relative: 1 % (ref 0–5)
HEMATOCRIT: 36 % — AB (ref 39.0–52.0)
Hemoglobin: 11.9 g/dL — ABNORMAL LOW (ref 13.0–17.0)
LYMPHS ABS: 2.6 10*3/uL (ref 0.7–4.0)
Lymphocytes Relative: 36 % (ref 12–46)
MCH: 26.4 pg (ref 26.0–34.0)
MCHC: 33.1 g/dL (ref 30.0–36.0)
MCV: 79.8 fL (ref 78.0–100.0)
MONO ABS: 0.7 10*3/uL (ref 0.1–1.0)
MONOS PCT: 10 % (ref 3–12)
MPV: 8.4 fL — ABNORMAL LOW (ref 8.6–12.4)
NEUTROS ABS: 3.7 10*3/uL (ref 1.7–7.7)
Neutrophils Relative %: 52 % (ref 43–77)
PLATELETS: 259 10*3/uL (ref 150–400)
RBC: 4.51 MIL/uL (ref 4.22–5.81)
RDW: 17.1 % — ABNORMAL HIGH (ref 11.5–15.5)
WBC: 7.2 10*3/uL (ref 4.0–10.5)

## 2015-02-13 NOTE — Progress Notes (Signed)
Subjective:   Patient ID: Eric Ford male    DOB: Sep 07, 1955 59 y.o.    MRN: [id number] Health Maintenance Due: Health Maintenance Due  Topic Date Due  . COLONOSCOPY  12/04/2013  . ZOSTAVAX  03/30/2014    _________________________________________________  HPI: [name] is a 59 y.o. male here for a routine visit.  Pt has a PMH outlined below.  Please see problem-based charting assessment and plan note for further details of medical issues addressed at today's visit.  PMH: Past Medical History  Diagnosis Date  . HIV infection   . Substance abuse     Medications: Current Outpatient Prescriptions on File Prior to Visit  Medication Sig Dispense Refill  . aspirin EC 81 MG tablet Take 1 tablet (81 mg total) by mouth daily. 100 tablet 3  . atorvastatin (LIPITOR) 40 MG tablet Take 1 tablet (40 mg total) by mouth at bedtime. 30 tablet 11  . ATRIPLA 600-200-300 MG per tablet TAKE 1 TABLET BY MOUTH EVERY NIGHT AT BEDTIME 30 tablet 6  . ENSURE (ENSURE) Take 1 Can by mouth 2 (two) times daily between meals. 237 mL 5  . hydrochlorothiazide (MICROZIDE) 12.5 MG capsule Take 1 capsule (12.5 mg total) by mouth daily. 30 capsule 0   No current facility-administered medications on file prior to visit.    Allergies: No Known Allergies  FH: Family History  Problem Relation Age of Onset  . Cancer Mother   . Heart disease Mother   . Hypertension Mother   . Heart attack Mother   . Hypertension Father   . Heart attack Father   . Cancer Sister   . Diabetes Sister   . Hyperlipidemia Sister   . Hypertension Sister   . Peripheral vascular disease Sister   . AAA (abdominal aortic aneurysm) Sister   . Diabetes Brother   . Hypertension Brother   . Hypertension Son     SH: History   Social History  . Marital Status: Single    Spouse Name: N/A  . Number of Children: N/A  . Years of Education: N/A   Social History Main Topics  . Smoking status: Former Smoker   Quit date: 01/11/2009  . Smokeless tobacco: Never Used  . Alcohol Use: No     Comment: very seldom  . Drug Use: 7.00 per week    Special: Marijuana  . Sexual Activity:    Partners: Female     Comment: pt. given condoms   Other Topics Concern  . Not on file   Social History Narrative    Review of Systems: Constitutional: Negative for fever, chills and weight loss.  Eyes: Negative for blurred vision.  Respiratory: Negative for cough and shortness of breath.  Cardiovascular: Negative for chest pain, palpitations and leg swelling.  Gastrointestinal: Negative for nausea, vomiting, abdominal pain, diarrhea, constipation and blood in stool.  Genitourinary: Negative for dysuria, urgency and frequency.  Musculoskeletal: Negative for myalgias and back pain. +Leg pain.   Neurological: Negative for dizziness, weakness and headaches.     Objective:   Vital Signs: Filed Vitals:   02/13/15 1607  BP: 127/84  Pulse: 75  Temp: 98.2 F (36.8 C)  TempSrc: Oral  Height: 5\' 5"  (1.651 m)  Weight: 107 lb 3.2 oz (48.626 kg)  SpO2: 100%      BP Readings from Last 3 Encounters:  02/13/15 127/84  08/15/14 123/74  06/16/14 108/70    Physical Exam: Constitutional: Vital signs reviewed.  Patient is in  NAD and cooperative with exam.  Head: Normocephalic and atraumatic. Eyes: EOMI, conjunctivae nl, no scleral icterus.  Neck: Supple. Cardiovascular: RRR, no MRG. Pulmonary/Chest: normal effort, CTAB, no wheezes, rales, or rhonchi. Abdominal: Thin. Soft. NT/ND +BS. Neurological: A&O x3, cranial nerves II-XII are grossly intact, moving all extremities. Extremities: 2+DP b/l; no pitting edema. Skin: Warm, dry and intact.    Assessment & Plan:   Assessment and plan was discussed and formulated with my attending.

## 2015-02-13 NOTE — Patient Instructions (Addendum)
Thank you for your visit today.   Please return to the internal medicine clinic in about 1 year or sooner if needed.     Your current medical regimen is effective;  continue present plan and take all medications as prescribed.    You need to make sure that you follow up with the gastroenterologist for your colon polyps.    I would encourage you to quit smoking all together.   Also, please be sure to follow up with vascular surgery regarding your left leg pain.    Please be sure to bring all of your medications with you to every visit; this includes herbal supplements, vitamins, eye drops, and any over-the-counter medications.   Should you have any questions regarding your medications and/or any new or worsening symptoms, please be sure to call the clinic at [phone].   If you believe that you are suffering from a life threatening condition or one that may result in the loss of limb or function, then you should call 911 or proceed to the nearest Emergency Department.   Smoking Cessation, Tips for Success If you are ready to quit smoking, congratulations! You have chosen to help yourself be healthier. Cigarettes bring nicotine, tar, carbon monoxide, and other irritants into your body. Your lungs, heart, and blood vessels will be able to work better without these poisons. There are many different ways to quit smoking. Nicotine gum, nicotine patches, a nicotine inhaler, or nicotine nasal spray can help with physical craving. Hypnosis, support groups, and medicines help break the habit of smoking. WHAT THINGS CAN I DO TO MAKE QUITTING EASIER?  Here are some tips to help you quit for good:  Pick a date when you will quit smoking completely. Tell all of your friends and family about your plan to quit on that date.  Do not try to slowly cut down on the number of cigarettes you are smoking. Pick a quit date and quit smoking completely starting on that day.  Throw away all cigarettes.    Clean and remove all ashtrays from your home, work, and car.  On a card, write down your reasons for quitting. Carry the card with you and read it when you get the urge to smoke.  Cleanse your body of nicotine. Drink enough water and fluids to keep your urine clear or pale yellow. Do this after quitting to flush the nicotine from your body.  Learn to predict your moods. Do not let a bad situation be your excuse to have a cigarette. Some situations in your life might tempt you into wanting a cigarette.  Never have "just one" cigarette. It leads to wanting another and another. Remind yourself of your decision to quit.  Change habits associated with smoking. If you smoked while driving or when feeling stressed, try other activities to replace smoking. Stand up when drinking your coffee. Brush your teeth after eating. Sit in a different chair when you read the paper. Avoid alcohol while trying to quit, and try to drink fewer caffeinated beverages. Alcohol and caffeine may urge you to smoke.  Avoid foods and drinks that can trigger a desire to smoke, such as sugary or spicy foods and alcohol.  Ask people who smoke not to smoke around you.  Have something planned to do right after eating or having a cup of coffee. For example, plan to take a walk or exercise.  Try a relaxation exercise to calm you down and decrease your stress. Remember, you may be tense  and nervous for the first 2 weeks after you quit, but this will pass.  Find new activities to keep your hands busy. Play with a pen, coin, or rubber band. Doodle or draw things on paper.  Brush your teeth right after eating. This will help cut down on the craving for the taste of tobacco after meals. You can also try mouthwash.   Use oral substitutes in place of cigarettes. Try using lemon drops, carrots, cinnamon sticks, or chewing gum. Keep them handy so they are available when you have the urge to smoke.  When you have the urge to smoke,  try deep breathing.  Designate your home as a nonsmoking area.  If you are a heavy smoker, ask your health care provider about a prescription for nicotine chewing gum. It can ease your withdrawal from nicotine.  Reward yourself. Set aside the cigarette money you save and buy yourself something nice.  Look for support from others. Join a support group or smoking cessation program. Ask someone at home or at work to help you with your plan to quit smoking.  Always ask yourself, "Do I need this cigarette or is this just a reflex?" Tell yourself, "Today, I choose not to smoke," or "I do not want to smoke." You are reminding yourself of your decision to quit.  Do not replace cigarette smoking with electronic cigarettes (commonly called e-cigarettes). The safety of e-cigarettes is unknown, and some may contain harmful chemicals.  If you relapse, do not give up! Plan ahead and think about what you will do the next time you get the urge to smoke. HOW WILL I FEEL WHEN I QUIT SMOKING? You may have symptoms of withdrawal because your body is used to nicotine (the addictive substance in cigarettes). You may crave cigarettes, be irritable, feel very hungry, cough often, get headaches, or have difficulty concentrating. The withdrawal symptoms are only temporary. They are strongest when you first quit but will go away within 10-14 days. When withdrawal symptoms occur, stay in control. Think about your reasons for quitting. Remind yourself that these are signs that your body is healing and getting used to being without cigarettes. Remember that withdrawal symptoms are easier to treat than the major diseases that smoking can cause.  Even after the withdrawal is over, expect periodic urges to smoke. However, these cravings are generally short lived and will go away whether you smoke or not. Do not smoke! WHAT RESOURCES ARE AVAILABLE TO HELP ME QUIT SMOKING? Your health care provider can direct you to community  resources or hospitals for support, which may include:  Group support.  Education.  Hypnosis.  Therapy. Document Released: 04/19/2004 Document Revised: 12/06/2013 Document Reviewed: 01/07/2013 [facility] Patient Information 2015 [place], [place]. This information is not intended to replace advice given to you by your health care provider. Make sure you discuss any questions you have with your health care provider.

## 2015-02-14 LAB — COMPLETE METABOLIC PANEL WITH GFR
ALT: 29 U/L (ref 0–53)
AST: 38 U/L — ABNORMAL HIGH (ref 0–37)
Albumin: 4 g/dL (ref 3.5–5.2)
Alkaline Phosphatase: 100 U/L (ref 39–117)
BILIRUBIN TOTAL: 0.2 mg/dL (ref 0.2–1.2)
BUN: 19 mg/dL (ref 6–23)
CALCIUM: 9.1 mg/dL (ref 8.4–10.5)
CO2: 22 mEq/L (ref 19–32)
Chloride: 107 mEq/L (ref 96–112)
Creat: 1.03 mg/dL (ref 0.50–1.35)
GFR, Est African American: >89 mL/min
GFR, Est Non African American: 79 mL/min
GLUCOSE: 108 mg/dL — AB (ref 70–99)
Potassium: 4.7 mEq/L (ref 3.5–5.3)
SODIUM: 139 meq/L (ref 135–145)
TOTAL PROTEIN: 8.1 g/dL (ref 6.0–8.3)

## 2015-02-14 LAB — LIPID PANEL
Cholesterol: 208 mg/dL — ABNORMAL HIGH (ref 0–200)
HDL: 53 mg/dL (ref >=40)
LDL CALC: 127 mg/dL — AB (ref 0–99)
Total CHOL/HDL Ratio: 3.9 Ratio
Triglycerides: 138 mg/dL (ref <150)
VLDL: 28 mg/dL (ref 0–40)

## 2015-02-15 ENCOUNTER — Other Ambulatory Visit: Payer: Self-pay | Admitting: Internal Medicine

## 2015-02-16 NOTE — Assessment & Plan Note (Addendum)
Had colonoscopy at 12/2010 with polypectomy.  Pathology revealing 3 tubular adenomas without high grade dysplasia.  Unfortunately, he has not follow up with Dr. Ardis Hughs. -gave recall letter from Dr. Ardis Hughs (stated he didn't receive it in the mail) -new referral to GI today  -check cbc  ADDENDUM: Cbc showed decreased hgb of 11.9.  Will check iron studies.   Lab Results  Component Value Date   HGB 11.9* 02/13/2015

## 2015-02-16 NOTE — Assessment & Plan Note (Signed)
-  lipid panel   ADDENDUM: LDL not at goal, will increase statin dose with close monitoring of transaminases

## 2015-02-16 NOTE — Assessment & Plan Note (Signed)
-  reminded and given recall letter from Dr. Ardis Hughs for repeat colonoscopy

## 2015-02-16 NOTE — Assessment & Plan Note (Addendum)
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  Medical mgmt for now, follows with Dr Oneida Alar who advised to quit smoking THC.  Today, he states his LLE pain is somewhat improved and he is still walking.  States he used to be able to only walk <1 block but now can walk 2 blocks.  Reports compliance with ASA and statin.  He is still smoking THC. -cont to f/u with vascular, has appt 11/17 -advised to quit smoking THC and cont ASA, statin  -obtain lipid panel  ADDENDUM: LDL still not at goal, will increase statin dose, monitor transaminases (AST marginally elevated)

## 2015-02-16 NOTE — Assessment & Plan Note (Addendum)
Follows with Dr. Linus Salmons.  Reports compliance with atripla and denies missing any doses. Last saw Dr. Linus Salmons in January 2016 to f/u in 6 months but did not see appt in system. -sent Kim a message to schedule f/u with Dr. Linus Salmons

## 2015-02-16 NOTE — Assessment & Plan Note (Signed)
-  advised to quit smoking THC

## 2015-02-16 NOTE — Assessment & Plan Note (Signed)
Wt. stable.  He cont drinking ensure daily. -cont to monitor

## 2015-02-16 NOTE — Assessment & Plan Note (Signed)
Completed treatment. -f/u with Dr. Linus Salmons

## 2015-02-16 NOTE — Assessment & Plan Note (Signed)
BP at goal.   -cont HCTZ  -check lipid panel  -cont ASA, statin

## 2015-02-17 LAB — FERRITIN: Ferritin: 9 ng/mL — ABNORMAL LOW (ref 22–322)

## 2015-02-17 LAB — IRON AND TIBC
%SAT: 7 % — ABNORMAL LOW (ref 20–55)
Iron: 33 ug/dL — ABNORMAL LOW (ref 42–165)
TIBC: 450 ug/dL — AB (ref 215–435)
UIBC: 417 ug/dL — ABNORMAL HIGH (ref 125–400)

## 2015-02-20 NOTE — Progress Notes (Signed)
Case discussed with Dr. Gordy Levan at the time of the visit.  We reviewed the resident's history and exam and pertinent patient test results.  I agree with the assessment, diagnosis, and plan of care documented in the resident's note. Madilyn Fireman MD MPH 02/20/2015 2:29 PM

## 2015-02-23 ENCOUNTER — Encounter: Payer: Self-pay | Admitting: *Deleted

## 2015-03-20 NOTE — Addendum Note (Signed)
Addended by: Hulan Fray on: 03/20/2015 06:52 PM   Modules accepted: Orders

## 2015-03-27 ENCOUNTER — Other Ambulatory Visit: Payer: Self-pay | Admitting: Internal Medicine

## 2015-03-27 DIAGNOSIS — B20 Human immunodeficiency virus [HIV] disease: Secondary | ICD-10-CM

## 2015-04-13 ENCOUNTER — Encounter: Payer: Self-pay | Admitting: Internal Medicine

## 2015-04-13 ENCOUNTER — Other Ambulatory Visit (HOSPITAL_COMMUNITY)
Admission: RE | Admit: 2015-04-13 | Discharge: 2015-04-13 | Disposition: A | Discharge status: Home / Self Care | Payer: Medicare Other | Source: Ambulatory Visit | Attending: Internal Medicine | Admitting: Internal Medicine

## 2015-04-13 ENCOUNTER — Ambulatory Visit (INDEPENDENT_AMBULATORY_CARE_PROVIDER_SITE_OTHER): Payer: Medicare Other | Admitting: Internal Medicine

## 2015-04-13 VITALS — BP 125/86 | HR 76 | Temp 97.3°F | Wt 99.0 lb

## 2015-04-13 DIAGNOSIS — Z113 Encounter for screening for infections with a predominantly sexual mode of transmission: Secondary | ICD-10-CM | POA: Diagnosis not present

## 2015-04-13 DIAGNOSIS — R636 Underweight: Secondary | ICD-10-CM | POA: Diagnosis not present

## 2015-04-13 DIAGNOSIS — B20 Human immunodeficiency virus [HIV] disease: Secondary | ICD-10-CM

## 2015-04-13 DIAGNOSIS — B182 Chronic viral hepatitis C: Secondary | ICD-10-CM

## 2015-04-13 LAB — RPR

## 2015-04-13 MED ORDER — ELVITEG-COBIC-EMTRICIT-TENOFAF 150-150-200-10 MG PO TABS: 1.0000 | ORAL_TABLET | Freq: Every day | ORAL | Status: DC

## 2015-04-13 NOTE — Progress Notes (Signed)
  Subjective:    Patient ID: Eric Ford, male    DOB: 09-16-1955, 59 y.o.   MRN: [id number]  HPI Here for routine follow up.  Continues on Atripla with a good CD4 and continued undetectable viral load last visit.  Denies any complaints.  No missed doses. Poor weight gain.  Does get food from [facility].  Has been treated for hep C and due for SVR24.  F0/1.  Feels well.    Review of Systems  Constitutional: Negative for fever, activity change, appetite change and fatigue.  HENT: Negative for sore throat and trouble swallowing.   Eyes: Negative for visual disturbance.  Respiratory: Negative for shortness of breath.   Cardiovascular: Negative for leg swelling.  Gastrointestinal: Negative for nausea, abdominal pain and diarrhea.  Musculoskeletal: Negative for myalgias and arthralgias.  Skin: Negative for rash.  Neurological: Negative for dizziness, light-headedness and headaches.  Hematological: Negative for adenopathy.  Psychiatric/Behavioral: Negative for dysphoric mood.       Objective:   Physical Exam  Constitutional: He appears well-developed and well-nourished. No distress.  HENT:  Mouth/Throat: No oropharyngeal exudate.  Eyes: No scleral icterus.  Cardiovascular: Normal rate, regular rhythm and normal heart sounds.   No murmur heard. Pulmonary/Chest: Effort normal and breath sounds normal. No respiratory distress.  Lymphadenopathy:    He has no cervical adenopathy.  Skin: No rash noted.          Assessment & Plan:

## 2015-04-13 NOTE — Assessment & Plan Note (Signed)
Encouraged more intake.

## 2015-04-13 NOTE — Assessment & Plan Note (Signed)
Svr 24 today.

## 2015-04-13 NOTE — Assessment & Plan Note (Signed)
Doing well. Will change to Genvoya and hopefully off efavirenz will increase appetite.  Labs today and RTC in 3 months on new medication.

## 2015-04-14 LAB — T-HELPER CELL (CD4) - (RCID CLINIC ONLY)
CD4 T CELL HELPER: 25 % — AB (ref 33–55)
CD4 T Cell Abs: 540 /uL (ref 400–2700)

## 2015-04-14 LAB — URINE CYTOLOGY ANCILLARY ONLY
Chlamydia: NEGATIVE
Neisseria Gonorrhea: NEGATIVE

## 2015-04-16 LAB — HIV-1 RNA QUANT-NO REFLEX-BLD
HIV 1 RNA Quant: <20 copies/mL (ref <20)
HIV-1 RNA Quant, Log: <1.3 {Log} (ref <1.30)

## 2015-05-24 ENCOUNTER — Other Ambulatory Visit: Payer: Self-pay | Admitting: Internal Medicine

## 2015-06-22 ENCOUNTER — Ambulatory Visit: Payer: Medicare Other | Admitting: Family

## 2015-06-22 ENCOUNTER — Encounter (HOSPITAL_COMMUNITY): Payer: Medicare Other

## 2015-07-13 ENCOUNTER — Ambulatory Visit: Payer: Medicare Other | Admitting: Internal Medicine

## 2015-08-01 ENCOUNTER — Encounter: Payer: Self-pay | Admitting: Family

## 2015-08-09 ENCOUNTER — Encounter: Payer: Self-pay | Admitting: Family

## 2015-08-09 ENCOUNTER — Encounter: Payer: Self-pay | Admitting: Internal Medicine

## 2015-08-09 ENCOUNTER — Ambulatory Visit (INDEPENDENT_AMBULATORY_CARE_PROVIDER_SITE_OTHER): Payer: Medicare Other | Admitting: Family

## 2015-08-09 ENCOUNTER — Ambulatory Visit (INDEPENDENT_AMBULATORY_CARE_PROVIDER_SITE_OTHER): Payer: Medicare Other | Admitting: Internal Medicine

## 2015-08-09 ENCOUNTER — Ambulatory Visit (HOSPITAL_COMMUNITY)
Admission: RE | Admit: 2015-08-09 | Discharge: 2015-08-09 | Disposition: A | Discharge status: Home / Self Care | Payer: Medicare Other | Source: Ambulatory Visit | Attending: Vascular Surgery | Admitting: Vascular Surgery

## 2015-08-09 VITALS — BP 92/60 | HR 54 | Temp 97.2°F | Resp 14 | Ht 66.0 in | Wt 113.0 lb

## 2015-08-09 VITALS — BP 102/57 | HR 52 | Temp 97.7°F | Ht 66.0 in | Wt 115.8 lb

## 2015-08-09 DIAGNOSIS — I70219 Atherosclerosis of native arteries of extremities with intermittent claudication, unspecified extremity: Secondary | ICD-10-CM | POA: Diagnosis not present

## 2015-08-09 DIAGNOSIS — B20 Human immunodeficiency virus [HIV] disease: Secondary | ICD-10-CM | POA: Diagnosis not present

## 2015-08-09 DIAGNOSIS — I10 Essential (primary) hypertension: Secondary | ICD-10-CM

## 2015-08-09 DIAGNOSIS — I70213 Atherosclerosis of native arteries of extremities with intermittent claudication, bilateral legs: Secondary | ICD-10-CM

## 2015-08-09 DIAGNOSIS — L853 Xerosis cutis: Secondary | ICD-10-CM | POA: Insufficient documentation

## 2015-08-09 DIAGNOSIS — L299 Pruritus, unspecified: Secondary | ICD-10-CM

## 2015-08-09 HISTORY — DX: Xerosis cutis: L85.3

## 2015-08-09 MED ORDER — CAMPHOR-MENTHOL 0.5-0.5 % EX LOTN: 1.0000 "application " | TOPICAL_LOTION | CUTANEOUS | Status: DC | PRN

## 2015-08-09 NOTE — Assessment & Plan Note (Signed)
Patient reports 3 week h/o generalized upper body itch, worse at night, preventing him from sleeping.  He denies involvement of his palms/soles, bites, or rashes associated with the itch.  He endorses h/o dry and sensitive skin, for which he only uses Dove soap and detergent.  He has not recently changed his soap or detergent.  He does not use lotion on his dry skin.  He has a h/o HIV, well-controlled on Genvoya.  This is a new drug, which he has been on for 3 weeks.  He denies trouble breathing or hives a/w the itch.  He has a h/o HCV s/p treatment.  Last HCV viral load was undetectable.  A/P: Xerosis vs drug reaction >> HIV, renal, liver, or thyroid.  HIV well controlled. BUN normal.  LFTs normal.  Last TSH was 2009.  Will attempt conservative management and recheck in 2 weeks. - Cerave or Cetaphil cream to skin BID with or without Vaseline - Sarna AAA PRN if no benefit from cream/vaseline - RTC 2 weeks.  If no improvement, will check TSH and CBC and discuss Genvoya with ID.

## 2015-08-09 NOTE — Progress Notes (Signed)
Patient ID: Eric Ford, male   DOB: 22-Mar-1956, 60 y.o.   MRN: [id number]    Subjective:   Patient ID: Eric Ford male   DOB: 1956-01-23 60 y.o.   MRN: [id number]  HPI: [name] is a 60 y.o. male with PMH as below, here for itching and hypotension.  Please see Problem-Based charting for the status of the patient's chronic medical issues.     Past Medical History  Diagnosis Date  . HIV infection ([place])   . Substance abuse    Current Outpatient Prescriptions  Medication Sig Dispense Refill  . aspirin EC 81 MG tablet Take 1 tablet (81 mg total) by mouth daily. (Patient not taking: Reported on 08/09/2015) 100 tablet 3  . atorvastatin (LIPITOR) 80 MG tablet Take 1 tablet (80 mg total) by mouth at bedtime. 30 tablet 12  . elvitegravir-cobicistat-emtricitabine-tenofovir (GENVOYA) 150-150-200-10 MG TABS tablet Take 1 tablet by mouth daily. 30 tablet 5  . ENSURE (ENSURE) Take 1 Can by mouth 2 (two) times daily between meals. (Patient not taking: Reported on 08/09/2015) 237 mL 5  . hydrochlorothiazide (MICROZIDE) 12.5 MG capsule TAKE ONE CAPSULE BY MOUTH DAILY 30 capsule 12   No current facility-administered medications for this visit.   Family History  Problem Relation Age of Onset  . Cancer Mother   . Heart disease Mother   . Hypertension Mother   . Heart attack Mother   . Hypertension Father   . Heart attack Father   . Cancer Sister   . Diabetes Sister   . Hyperlipidemia Sister   . Hypertension Sister   . Peripheral vascular disease Sister   . AAA (abdominal aortic aneurysm) Sister   . Diabetes Brother   . Hypertension Brother   . Hypertension Son    Social History   Social History  . Marital Status: Single    Spouse Name: N/A  . Number of Children: N/A  . Years of Education: N/A   Social History Main Topics  . Smoking status: Former Smoker    Quit date: 01/11/2009  . Smokeless tobacco: Never Used  . Alcohol Use: No     Comment: very seldom  . Drug  Use: 7.00 per week    Special: Marijuana  . Sexual Activity:    Partners: Female     Comment: pt. given condoms   Other Topics Concern  . Not on file   Social History Narrative   Review of Systems: Negative for bowel/bladder incontinence, tongue biting, or generalized shaking.  Negative for CP, SOB, or palpitations.  Negative for dysarthria or asymmetric weakness.  Positive for numbness/tingling of fingertips of bilateral hands and generalized weakness prior to episodes.   No fever, chills, N/V, C/D, or dysuria.  Objective:  Physical Exam: There were no vitals filed for this visit. Physical Exam  Constitutional: He is oriented to person, place, and time.  Thin male, appears older than stated age. NAD  HENT:  Head: Normocephalic and atraumatic.  Eyes: EOM are normal. No scleral icterus.  Neck: No JVD present. No tracheal deviation present.  Cardiovascular: Normal rate, regular rhythm and normal heart sounds.   Pulmonary/Chest: Effort normal and breath sounds normal. No respiratory distress. He has no wheezes.  Abdominal: Soft. He exhibits no distension. There is no tenderness. There is no rebound and no guarding.  No hepatomegaly.  Musculoskeletal: He exhibits no edema.  Neurological: He is alert and oriented to person, place, and time.  Skin: Skin is warm and dry.  No rash noted.     Assessment & Plan:   Patient and case were discussed with Dr. Lynnae January.  Please refer to Problem Based charting for further documentation.

## 2015-08-09 NOTE — Assessment & Plan Note (Signed)
BP Readings from Last 3 Encounters:  08/09/15 102/57  08/09/15 92/60  04/13/15 125/86    Lab Results  Component Value Date   NA 139 02/13/2015   K 4.7 02/13/2015   CREATININE 1.03 02/13/2015   Patient reports multiple episodes of feeling lightheaded and dizzy, with tingling of his fingertips.  He had one episode of LOC 2 weeks ago.  At that time, he was cleaning his house, felt like he was going to pass out, and lost consciousness.  He states the prodrome occurs multiple times a day, usually while standing.  He feels the prodrome and knows to sit down.  During or preceding these episodes, he denies loss of bowel/bladder control, tongue biting, SOB, CP, palpitations, dysarthria, or asymmetric weakness.  He endorses generalized weakness when he feels lightheaded.  He denies h/o MI, TIA, stroke, or seizure.  He has PAD and has not been taking his ASA because he ran out.   His only BP medication at this time is HCTZ 12.5 mg daily.  Filed Vitals:   08/09/15 1114  BP: 102/57  Pulse: 52  Temp: 97.7 F (36.5 C)   Orthostatic Vital Signs: Lying: 113/69 (51) Sitting: 111/61 (50) Standing: 108/63 (53) No symptoms during testing.  A/P: Hypotension, likely orthostasis despite negative testing.  Will hold HCTZ and recheck BP in 2 weeks.  Low concern for ACS, arrhythmia, or TIA/stroke. - HOLD HCTZ - RTC 2 weeks for BP recheck.

## 2015-08-09 NOTE — Patient Instructions (Signed)
Intermittent Claudication Intermittent claudication is pain in your leg that occurs when you walk or exercise and goes away when you rest. The pain can occur in one or both legs. CAUSES Intermittent claudication is caused by the buildup of plaque within the major arteries in the body (atherosclerosis). The plaque, which makes arteries stiff and narrow, prevents enough blood from reaching your leg muscles. The pain occurs when you walk or exercise because your muscles need more blood when you are moving and exercising. RISK FACTORS Risk factors include:  A family history of atherosclerosis.  A personal history of stroke or heart disease.  Older age.  Being inactive or overweight.  Smoking cigarettes.  Having another health condition such as:  Diabetes.  High blood pressure.  High cholesterol. SIGNS AND SYMPTOMS  Your hip or leg may:   Ache.  Cramp.  Feel tight.  Feel weak.  Feel heavy. Over time, you may feel pain in your calf, thigh, or hip. DIAGNOSIS  Your health care provider may diagnose intermittent claudication based on your symptoms and medical history. Your health care provider may also do tests to learn more about your condition. These may include:  Blood tests.  An ultrasound.  Imaging tests such as angiography, magnetic resonance angiography (MRA), and computed tomography angiography (CTA). TREATMENT You may be treated for problems such as:  High blood pressure.  High cholesterol.  Diabetes. Other treatments may include:  Lifestyle changes such as:  Starting an exercise program.  Losing weight.  Quitting smoking.  Medicines to help restore blood flow through your legs.  Blood vessel surgery (angioplasty) to restore blood flow if your intermittent claudication is caused by severe peripheral artery disease. HOME CARE INSTRUCTIONS  Manage any other health conditions you have.  Eat a diet low in saturated fats and calories to maintain a  healthy weight.  Quit smoking, if you smoke.  Take medicines only as directed by your health care provider.  If your health care provider recommended an exercise program for you, follow it as directed. Your exercise program may involve:  Walking three or more times a week.  Walking until you have certain symptoms of intermittent claudication.  Resting until symptoms go away.  Gradually increasing walking time to about 50 minutes a day. SEEK MEDICAL CARE IF: Your condition is not getting better or is getting worse. SEEK IMMEDIATE MEDICAL CARE IF:   You have chest pain.  You have difficulty breathing.  You develop arm weakness.  You have trouble speaking.  Your face begins to droop. MAKE SURE YOU:  Understand these instructions.  Will watch your condition.  Will get help if you are not doing well or get worse.   This information is not intended to replace advice given to you by your health care provider. Make sure you discuss any questions you have with your health care provider.   Document Released: 05/24/2004 Document Revised: 08/12/2014 Document Reviewed: 10/28/2013 [employer] Interactive Patient Education 2016 [employer]  

## 2015-08-09 NOTE — Progress Notes (Signed)
Established Intermittent Claudication  History of Present Illness  Eric Ford is a 60 y.o. (26-Dec-1955) male patient of Dr. Oneida Alar who presents for follow up for left leg claudication. The patient's treatment regimen currently included: maximal medical management and walking program. The patient's symptoms have not progressed.  Previously, he described having to stop three times in one block due to left calf pain, and to a lesser extent right calf pain. Today, he says he is able to walk a block without stopping. He usually walks about 4 blocks every morning.  He reports quitting cigarettes in 2013 but smokes marijuana about once/week to help with sleep.  On ROS today: he denies any rest pain and non healing wounds.   The pt was last evaluated by Dr. Oneida Alar on 06/19/14. At that time his claudication wais not lifestyle limiting and he was satisfied with his walking distance. His ABIs were reasonable and he was not at risk of limb loss. Dr. Oneida Alar advised the pt to continue a walking program of 30 minutes daily and continue his aspirin. Dr. Oneida Alar discussed with him stopping smoking marijuana. He was to follow-up in 6 months time with repeat ABIs.  He states he feels light headed after walking or standing for about 30 minutes and he reports a syncopal episode about 2 months ago in which he fell and injured an eye orbit; note today that his blood pressure is 92/60. Also noted that his only medication that may affect his hemodynamics is HCTZ. Pt states he does not retain fluid in his legs or lungs. He states he drinks at least 8 cups of fluid per day.  He also states he has had a severe itching sensation on his shoulders, back, and sometimes his anterior thighs in the last 2 weeks.   He is unsure about the medications he takes other than his HIV combination, he states he is no longer taking 81 mg ASA as he ran out and has trouble affording.  He is not sure if he takes atorvastatin.   Past  Medical History  Diagnosis Date  . HIV infection   . Substance abuse     Social History Social History  Substance Use Topics  . Smoking status: Former Smoker    Quit date: 01/11/2009  . Smokeless tobacco: Never Used  . Alcohol Use: No     Comment: very seldom    Family History Family History  Problem Relation Age of Onset  . Cancer Mother   . Heart disease Mother   . Hypertension Mother   . Heart attack Mother   . Hypertension Father   . Heart attack Father   . Cancer Sister   . Diabetes Sister   . Hyperlipidemia Sister   . Hypertension Sister   . Peripheral vascular disease Sister   . AAA (abdominal aortic aneurysm) Sister   . Diabetes Brother   . Hypertension Brother   . Hypertension Son     Surgical History Past Surgical History  Procedure Laterality Date  . Trunk wound repair / closure    . Colonoscopy    . Polypectomy    . Colonoscopy      done years [facility] at [facility]    No Known Allergies  Current Outpatient Prescriptions  Medication Sig Dispense Refill  . aspirin EC 81 MG tablet Take 1 tablet (81 mg total) by mouth daily. 100 tablet 3  . atorvastatin (LIPITOR) 80 MG tablet Take 1 tablet (80 mg total) by mouth  at bedtime. 30 tablet 12  . elvitegravir-cobicistat-emtricitabine-tenofovir (GENVOYA) 150-150-200-10 MG TABS tablet Take 1 tablet by mouth daily. 30 tablet 5  . ENSURE (ENSURE) Take 1 Can by mouth 2 (two) times daily between meals. 237 mL 5  . hydrochlorothiazide (MICROZIDE) 12.5 MG capsule TAKE ONE CAPSULE BY MOUTH DAILY 30 capsule 12   No current facility-administered medications for this visit.    REVIEW OF SYSTEMS: see HPI for pertinent positives and negatives    Physical Examination  Filed Vitals:   08/09/15 0847  BP: 92/60  Pulse: 54  Temp: 97.2 F (36.2 C)  Resp: 14  Height: 5\' 6"  (1.676 m)  Weight: 113 lb (51.256 kg)  SpO2: 100%   Body mass index is 18.25 kg/(m^2).  General: The patient appears their stated age, thin  male.   HEENT:  No gross abnormalities Pupils are equal. Pulmonary: Respirations are non-labored. Abdomen: Soft and non-tender. Musculoskeletal: There are no major deformities. No signs of ischemia in his lower extremities. Neurologic: No focal weakness or paresthesias are detected. Skin: There are no ulcers or rashes. Psychiatric: The patient has normal affect. Cardiovascular: There is a regular rate and rhythm without significant murmur appreciated.   Vascular: Vessel Right Left  Radial 2+Palpable 2+Palpable  Carotid  without bruit  without bruit  Aorta Not palpable N/A  Femoral 2+Palpable 3+Palpable  Popliteal Not palpable Not palpable  PT 1+Palpable Not Palpable  DP 2+Palpable Not Palpable   Non-Invasive Vascular Imaging ABI (Date: 08/09/2015)  R: 0.91 (0.98, 06/16/14), DP: triphasic, PT: triphasic, TBI: 0.72 (normal)  L: 0.76 (0.68), DP: monophasic, PT: monopahsic, TBI: 0.29 (dampened)   Medical Decision Making  [name] is a 60 y.o. male who presents with bilateral intermittent calf claudication, left worse than right,  without evidence of critical limb ischemia. 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 I advised pt to see his PCP ASAP re his light headedness and hypotension, and also about his pruritus.    Based on the patient's vascular studies and examination, I have offered the patient: ABI's and see me in a year. I advised him to notify our office if his claudication worsens or he develops non healing wounds in his LE's.   I discussed in depth with the patient the nature of atherosclerosis, and emphasized the importance of maximal medical management including strict control of blood pressure, blood glucose, and lipid levels, antiplatelet agents, obtaining regular exercise,  and cessation of smoking.    The patient is aware that without maximal medical management the underlying atherosclerotic disease process will progress, limiting the benefit of any interventions. The patient is currently on a statin: atorvastatin 80 mg daily.  The patient is currently not on an anti-platelet.  The patient was advised to reume ASA 81 mg PO daily.  Thank you for allowing [place] to participate in this patient's care.  Arlesia Kiel, Sharmon Leyden, RN, MSN, FNP-C [facility] Office: [phone]  08/09/2015, 8:42 AM  Clinic MD: Oneida Alar

## 2015-08-09 NOTE — Progress Notes (Signed)
Internal Medicine Clinic Attending  Case discussed with Dr. Taylor at the time of the visit.  We reviewed the resident's history and exam and pertinent patient test results.  I agree with the assessment, diagnosis, and plan of care documented in the resident's note. 

## 2015-08-09 NOTE — Patient Instructions (Signed)
1. STOP taking Hydrochlorothiazide (HCTZ). 2. Apply thick, hypoallergenic cream (Cerave, Cetaphil) to dry skin (chest, back, arms) twice daily. Vaseline may also be used. 3. Fill Sarna prescription if itching not relieved with Cream/Vaseline. 4. Return to clinic in 2 weeks.

## 2015-08-10 NOTE — Addendum Note (Signed)
Addended by: Dorthula Rue L on: 08/10/2015 12:51 PM   Modules accepted: Orders

## 2015-08-23 ENCOUNTER — Encounter: Payer: Self-pay | Admitting: Internal Medicine

## 2015-08-23 ENCOUNTER — Ambulatory Visit (INDEPENDENT_AMBULATORY_CARE_PROVIDER_SITE_OTHER): Payer: Medicare Other | Admitting: Internal Medicine

## 2015-08-23 VITALS — BP 121/63 | HR 66 | Temp 98.0°F | Wt 118.5 lb

## 2015-08-23 DIAGNOSIS — Z87891 Personal history of nicotine dependence: Secondary | ICD-10-CM

## 2015-08-23 DIAGNOSIS — L299 Pruritus, unspecified: Secondary | ICD-10-CM | POA: Diagnosis not present

## 2015-08-23 DIAGNOSIS — I10 Essential (primary) hypertension: Secondary | ICD-10-CM | POA: Diagnosis not present

## 2015-08-23 NOTE — Progress Notes (Signed)
Patient ID: Eric Ford, male   DOB: 26-Jan-1956, 60 y.o.   MRN: [id number]    Subjective:   Patient ID: Eric Ford male   DOB: 1956/05/30 60 y.o.   MRN: [id number]  HPI: [name] is a 60 y.o. male with PMH as below, here for f/u BP and itch.  Please see Problem-Based charting for the status of the patient's chronic medical issues.     Past Medical History  Diagnosis Date  . HIV infection ([place])   . Substance abuse    Current Outpatient Prescriptions  Medication Sig Dispense Refill  . aspirin EC 81 MG tablet Take 1 tablet (81 mg total) by mouth daily. (Patient not taking: Reported on 08/09/2015) 100 tablet 3  . atorvastatin (LIPITOR) 80 MG tablet Take 1 tablet (80 mg total) by mouth at bedtime. 30 tablet 12  . camphor-menthol (SARNA) lotion Apply 1 application topically as needed for itching. 222 mL 0  . elvitegravir-cobicistat-emtricitabine-tenofovir (GENVOYA) 150-150-200-10 MG TABS tablet Take 1 tablet by mouth daily. 30 tablet 5  . ENSURE (ENSURE) Take 1 Can by mouth 2 (two) times daily between meals. (Patient not taking: Reported on 08/09/2015) 237 mL 5  . hydrochlorothiazide (MICROZIDE) 12.5 MG capsule TAKE ONE CAPSULE BY MOUTH DAILY 30 capsule 12   No current facility-administered medications for this visit.   Family History  Problem Relation Age of Onset  . Cancer Mother   . Heart disease Mother   . Hypertension Mother   . Heart attack Mother   . Hypertension Father   . Heart attack Father   . Cancer Sister   . Diabetes Sister   . Hyperlipidemia Sister   . Hypertension Sister   . Peripheral vascular disease Sister   . AAA (abdominal aortic aneurysm) Sister   . Diabetes Brother   . Hypertension Brother   . Hypertension Son    Social History   Social History  . Marital Status: Single    Spouse Name: N/A  . Number of Children: N/A  . Years of Education: N/A   Social History Main Topics  . Smoking status: Former Smoker    Quit date: 01/11/2009  .  Smokeless tobacco: Never Used  . Alcohol Use: No     Comment: very seldom  . Drug Use: 7.00 per week    Special: Marijuana  . Sexual Activity:    Partners: Female     Comment: pt. given condoms   Other Topics Concern  . Not on file   Social History Narrative   Review of Systems: Positive for nocturia. Negative for dysuria, urgency, hesitancy, or dribbling. Objective:  Physical Exam: There were no vitals filed for this visit. Physical Exam  Constitutional: He is oriented to person, place, and time and well-developed, well-nourished, and in no distress. No distress.  HENT:  Head: Normocephalic and atraumatic.  Eyes: EOM are normal. No scleral icterus.  Neck: No JVD present.  Cardiovascular: Normal rate, regular rhythm and normal heart sounds.   Pulmonary/Chest: Effort normal and breath sounds normal. No stridor. No respiratory distress. He has no wheezes.  Musculoskeletal: He exhibits no edema.  Neurological: He is alert and oriented to person, place, and time.  Skin: Skin is warm and dry. He is not diaphoretic.     Assessment & Plan:   Patient and case were discussed with Dr. Evette Doffing.  Please refer to Problem Based charting for further documentation.

## 2015-08-23 NOTE — Assessment & Plan Note (Signed)
Patient continues to have itch, worse at night.  He was not able to afford Sarna or cream.  He had an old jar of Triamcinolone cream at home that he has been applying.  He has had minimal relief and still wakes frequently at night.  He also wakes at night to urinate, sometimes 10x/night.  It is unclear whether he wakes because he has to pee or because he itches.  He drinks liquids all day, right up until he goes to bed. He denies hesitancy, urgency, or dysuria.  He endorses strong and appropriate urine stream.  A/P: Itch, likely Xerosis.   - STOP Triamcinolone - Vaseline BID - Decrease fluid intake after 8pm - Consider Hydroxyzine for itch if no relief with Vaseline - RTC 2 months

## 2015-08-23 NOTE — Assessment & Plan Note (Signed)
HCTZ was stopped last visit 2/2 hypotension and orthostatic symptoms. Patient denies CP, SOB, dizziness, lightheadedness, HA, or blurry vision.  His BP is controlled off medication.  BP Readings from Last 3 Encounters:  08/23/15 121/63  08/09/15 102/57  08/09/15 92/60    Lab Results  Component Value Date   NA 139 02/13/2015   K 4.7 02/13/2015   CREATININE 1.03 02/13/2015    Assessment: Blood pressure control:  good Progress toward BP goal:    Comments: HCTZ stopped last month.  Plan: Medications:  Continue monitoring BP off medications. STOP HCTZ Educational resources provided:   Self management tools provided:   Other plans: recheck 2 months

## 2015-08-23 NOTE — Patient Instructions (Signed)
1. DO NOT USE Triamcinolone. 2. Apply Vaseline twice daily to dry/itchy skin. 3. Do not drink water/fluids after 8 pm. 4. Return to clinic in 2 months.

## 2015-08-24 NOTE — Progress Notes (Signed)
Internal Medicine Clinic Attending  Case discussed with Dr. Taylor at the time of the visit.  We reviewed the resident's history and exam and pertinent patient test results.  I agree with the assessment, diagnosis, and plan of care documented in the resident's note. 

## 2015-10-16 ENCOUNTER — Ambulatory Visit (INDEPENDENT_AMBULATORY_CARE_PROVIDER_SITE_OTHER): Payer: Medicare Other | Admitting: Internal Medicine

## 2015-10-16 ENCOUNTER — Encounter: Payer: Self-pay | Admitting: Internal Medicine

## 2015-10-16 ENCOUNTER — Other Ambulatory Visit: Payer: Self-pay | Admitting: Internal Medicine

## 2015-10-16 VITALS — BP 131/71 | HR 73 | Temp 98.0°F | Wt 122.2 lb

## 2015-10-16 DIAGNOSIS — Z0000 Encounter for general adult medical examination without abnormal findings: Secondary | ICD-10-CM | POA: Diagnosis not present

## 2015-10-16 DIAGNOSIS — L853 Xerosis cutis: Secondary | ICD-10-CM

## 2015-10-16 DIAGNOSIS — I739 Peripheral vascular disease, unspecified: Secondary | ICD-10-CM

## 2015-10-16 DIAGNOSIS — Z23 Encounter for immunization: Secondary | ICD-10-CM

## 2015-10-16 DIAGNOSIS — B20 Human immunodeficiency virus [HIV] disease: Secondary | ICD-10-CM

## 2015-10-16 DIAGNOSIS — D126 Benign neoplasm of colon, unspecified: Secondary | ICD-10-CM | POA: Diagnosis not present

## 2015-10-16 DIAGNOSIS — Z21 Asymptomatic human immunodeficiency virus [HIV] infection status: Secondary | ICD-10-CM

## 2015-10-16 DIAGNOSIS — R636 Underweight: Secondary | ICD-10-CM

## 2015-10-16 DIAGNOSIS — I10 Essential (primary) hypertension: Secondary | ICD-10-CM | POA: Diagnosis not present

## 2015-10-16 DIAGNOSIS — I70213 Atherosclerosis of native arteries of extremities with intermittent claudication, bilateral legs: Secondary | ICD-10-CM | POA: Diagnosis not present

## 2015-10-16 MED ORDER — FERROUS SULFATE 325 (65 FE) MG PO TABS: 325.0000 mg | ORAL_TABLET | Freq: Every day | ORAL | Status: DC

## 2015-10-16 MED ORDER — NICOTINE 14 MG/24HR TD PT24
14.0000 mg | MEDICATED_PATCH | TRANSDERMAL | Status: DC
Start: 2015-10-16 — End: 2015-11-29

## 2015-10-16 NOTE — Assessment & Plan Note (Signed)
A: Xerosis: Pt states he is still having itchy skin primarily on his back which he cannot reach with vaseline.  He uses dove soap.  P: -reminded him to take warm instead of hot showers -also discussed not using bleach on his clothes but instead using a fragrance free detergent such as Tide or similar brand. -cont vaseline

## 2015-10-16 NOTE — Assessment & Plan Note (Signed)
A: HTN: Currently good control without any meds. P: -advised to quit smoking

## 2015-10-16 NOTE — Assessment & Plan Note (Addendum)
A: History of tubular adenoma of colon: He was to f/u for repeat coloscopy and was given the recall letter by [facility] at his last OV.  He seems to not recall this.  He has developed iron def anemia but denies symptoms of fatigue,etc. P: -f/u with Dr. Ardis Hughs -will start ferrous sulfate once daily  -repeat cbc/diff

## 2015-10-16 NOTE — Assessment & Plan Note (Signed)
A: Underweight: Weight has actually gone up some since last OV.  He is unable to obtain ensure anymore. P: -cont to monitor weight -will ask Shana to help with Ensure

## 2015-10-16 NOTE — Assessment & Plan Note (Addendum)
-  advised him to f/u with GI regarding repeat colonoscopy, Ulis Rias will f/u with an appt -influenza vaccine today  -concerned about possible cognitive impairment and have asked Shana to help with additional home resources

## 2015-10-16 NOTE — Assessment & Plan Note (Addendum)
A: PAD: Last saw vascular in January 2017 and repeated ABIs with R: 0.91 and L: 0.76.  He was not taking ASA but was maybe taking atorvastatin?  Today he is c/o bilateral leg pain and is walking but has difficulty with pain and has to stop after about 1 block.  He reports the pain improves upon rest.   P: -f/u with vascular in 1 year with repeat ABIs -advised to resume ASA and continue atorvastatin  -advised to quit smoking and will offer nicoderm patch

## 2015-10-16 NOTE — Patient Instructions (Signed)
Thank you for your visit today.   Please return to the internal medicine clinic in 6 month(s) or sooner if needed.     I have made the following additions/changes to your medications:  Please remember to take an 81mg  aspirin daily.  Also please remember to take atorvastatin.  Please take all of your medications. It would be beneficial if you were to stop smoking.  Please remember to walk 30 minutes for your legs.  Please continue all of your medications.    Please be sure to bring all of your medications with you to every visit; this includes herbal supplements, vitamins, eye drops, and any over-the-counter medications.   Should you have any questions regarding your medications and/or any new or worsening symptoms, please be sure to call the clinic at [phone].   If you believe that you are suffering from a life threatening condition or one that may result in the loss of limb or function, then you should call 911 and proceed to the nearest Emergency Department.   Peripheral Vascular Disease Peripheral vascular disease (PVD) is a disease of the blood vessels that are not part of your heart and brain. A simple term for PVD is poor circulation. In most cases, PVD narrows the blood vessels that carry blood from your heart to the rest of your body. This can result in a decreased supply of blood to your arms, legs, and internal organs, like your stomach or kidneys. However, it most often affects a person's lower legs and feet. There are two types of PVD.  Organic PVD. This is the more common type. It is caused by damage to the structure of blood vessels.  Functional PVD. This is caused by conditions that make blood vessels contract and tighten (spasm). Without treatment, PVD tends to get worse over time. PVD can also lead to acute ischemic limb. This is when an arm or limb suddenly has trouble getting enough blood. This is a medical emergency.  HOME CARE  Take medicines only as told by  your doctor.  Do not use any tobacco products, including cigarettes, chewing tobacco, or electronic cigarettes. If you need help quitting, ask your doctor.  Lose weight if you are overweight, and maintain a healthy weight as told by your doctor.  Eat a diet that is low in fat and cholesterol. If you need help, ask your doctor.  Exercise regularly. Ask your doctor for some good activities for you.  Take good care of your feet.  Wear comfortable shoes that fit well.  Check your feet often for any cuts or sores. GET HELP IF:  You have cramps in your legs while walking.  You have leg pain when you are at rest.  You have coldness in a leg or foot.  Your skin changes.  You are unable to get or have an erection (erectile dysfunction).  You have cuts or sores on your feet that are not healing. GET HELP RIGHT AWAY IF:  Your arm or leg turns cold and blue.  Your arms or legs become red, warm, swollen, painful, or numb.  You have chest pain or trouble breathing.  You suddenly have weakness in your face, arm, or leg.  You become very confused or you cannot speak.  You suddenly have a very bad headache.  You suddenly cannot see.   This information is not intended to replace advice given to you by your health care provider. Make sure you discuss any questions you have with your health  care provider.   Document Released: 10/16/2009 Document Revised: 08/12/2014 Document Reviewed: 12/30/2013 Elsevier Interactive Patient Education [employer].

## 2015-10-16 NOTE — Progress Notes (Signed)
Patient ID: ED ANNESS, male   DOB: 1956-04-22, 60 y.o.   MRN: [id number]     Subjective:   Patient ID: COSTANTINO ROSSBERG male    DOB: 20-Nov-1955 60 y.o.    MRN: [id number] Health Maintenance Due: Health Maintenance Due  Topic Date Due  . COLONOSCOPY  12/04/2013  . INFLUENZA VACCINE  03/06/2015    _________________________________________________  HPI: Mr.Bensyn H Ramos is a 60 y.o. male here for bilateral leg pain and f/u of chronic medical problems.  Pt has a PMH outlined below.  Please see problem-based charting assessment and plan for further status of patient's chronic medical problems addressed at today's visit.  PMH: Past Medical History  Diagnosis Date  . HIV infection ([place])   . Substance abuse     Medications: Current Outpatient Prescriptions on File Prior to Visit  Medication Sig Dispense Refill  . aspirin EC 81 MG tablet Take 1 tablet (81 mg total) by mouth daily. (Patient not taking: Reported on 08/09/2015) 100 tablet 3  . atorvastatin (LIPITOR) 80 MG tablet Take 1 tablet (80 mg total) by mouth at bedtime. 30 tablet 12  . camphor-menthol (SARNA) lotion Apply 1 application topically as needed for itching. 222 mL 0  . elvitegravir-cobicistat-emtricitabine-tenofovir (GENVOYA) 150-150-200-10 MG TABS tablet Take 1 tablet by mouth daily. 30 tablet 5  . ENSURE (ENSURE) Take 1 Can by mouth 2 (two) times daily between meals. (Patient not taking: Reported on 08/09/2015) 237 mL 5   No current facility-administered medications on file prior to visit.    Allergies: No Known Allergies  FH: Family History  Problem Relation Age of Onset  . Cancer Mother   . Heart disease Mother   . Hypertension Mother   . Heart attack Mother   . Hypertension Father   . Heart attack Father   . Cancer Sister   . Diabetes Sister   . Hyperlipidemia Sister   . Hypertension Sister   . Peripheral vascular disease Sister   . AAA (abdominal aortic aneurysm) Sister   . Diabetes Brother   .  Hypertension Brother   . Hypertension Son     SH: Social History   Social History  . Marital Status: Single    Spouse Name: N/A  . Number of Children: N/A  . Years of Education: N/A   Social History Main Topics  . Smoking status: Current Every Day Smoker    Types: Cigars    Last Attempt to Quit: 01/11/2009  . Smokeless tobacco: Never Used     Comment: smokes 3-4 cigars daily  . Alcohol Use: No     Comment: very seldom  . Drug Use: 7.00 per week    Special: Marijuana  . Sexual Activity:    Partners: Female     Comment: pt. given condoms   Other Topics Concern  . None   Social History Narrative    Review of Systems: Constitutional: Negative for fever, chills and weight loss.  Eyes: Negative for blurred vision.  Respiratory: Negative for cough and shortness of breath.  Cardiovascular: Negative for chest pain, palpitations and leg swelling, +bilateral leg pain.  Gastrointestinal: Negative for nausea, vomiting, abdominal pain, diarrhea, constipation and blood in stool.  Genitourinary: Negative for dysuria, urgency and frequency.  Musculoskeletal: Negative for myalgias and back pain.  Neurological: Negative for dizziness, weakness and headaches.  Skin: +itchy skin.    Objective:   Vital Signs: Filed Vitals:   10/16/15 1353  BP: 131/71  Pulse: 73  Temp: 98  F (36.7 C)  TempSrc: Oral  Weight: 122 lb 3.2 oz (55.43 kg)  SpO2: 100%     BP Readings from Last 3 Encounters:  10/16/15 131/71  08/23/15 121/63  08/09/15 102/57    Physical Exam: Constitutional: Vital signs reviewed.  Patient is in NAD and cooperative with exam.  Head: Normocephalic and atraumatic. Eyes: EOMI, conjunctivae nl, no scleral icterus.  Neck: Supple. Cardiovascular: RRR, no MRG. Pulmonary/Chest: normal effort, CTAB, no wheezes, rales, or rhonchi. Abdominal: Thin, soft. NT/ND +BS. Neurological: Alert, cranial nerves II-XII are grossly intact, moving all extremities. Extremities: No LE  edema.  Skin: Warm, dry and intact. No rash.   Assessment & Plan:   Assessment and plan was discussed and formulated with my attending.

## 2015-10-16 NOTE — Assessment & Plan Note (Addendum)
A: HIV: Reports compliance with [facility].  However, has not seen Dr. Linus Salmons recently and missed his 07/2015 appt.  I reminded him of this but he seemed unaware of who Dr. Linus Salmons was.  I am concerned about some cognitive impairment.  P: -will check HIV viral load and CD4  -will ask him to f/u with Dr. Linus Salmons -cont Jorje Guild  -will ask Edwena Blow to help with home resources  -will need to check mini-cog at next OV

## 2015-10-17 ENCOUNTER — Telehealth: Payer: Self-pay | Admitting: Licensed Clinical Social Worker

## 2015-10-17 LAB — CMP14 + ANION GAP
ALT: 9 IU/L (ref 0–44)
ANION GAP: 17 mmol/L (ref 10.0–18.0)
AST: 20 IU/L (ref 0–40)
Albumin/Globulin Ratio: 1 — ABNORMAL LOW (ref 1.2–2.2)
Albumin: 3.9 g/dL (ref 3.6–4.8)
Alkaline Phosphatase: 106 IU/L (ref 39–117)
BUN/Creatinine Ratio: 13 (ref 10–22)
BUN: 13 mg/dL (ref 8–27)
Bilirubin Total: 0.2 mg/dL (ref 0.0–1.2)
CALCIUM: 8.7 mg/dL (ref 8.6–10.2)
CO2: 20 mmol/L (ref 18–29)
CREATININE: 1 mg/dL (ref 0.76–1.27)
Chloride: 105 mmol/L (ref 96–106)
GFR calc Af Amer: 93 mL/min/{1.73_m2} (ref >59)
GFR, EST NON AFRICAN AMERICAN: 81 mL/min/{1.73_m2} (ref >59)
GLUCOSE: 92 mg/dL (ref 65–99)
Globulin, Total: 4 g/dL (ref 1.5–4.5)
Potassium: 4.4 mmol/L (ref 3.5–5.2)
SODIUM: 142 mmol/L (ref 134–144)
Total Protein: 7.9 g/dL (ref 6.0–8.5)

## 2015-10-17 LAB — CBC WITH DIFFERENTIAL/PLATELET
BASOS: 1 %
Basophils Absolute: 0 10*3/uL (ref 0.0–0.2)
EOS (ABSOLUTE): 0.1 10*3/uL (ref 0.0–0.4)
EOS: 2 %
HEMATOCRIT: 33.3 % — AB (ref 37.5–51.0)
Hemoglobin: 10.4 g/dL — ABNORMAL LOW (ref 12.6–17.7)
IMMATURE GRANS (ABS): 0 10*3/uL (ref 0.0–0.1)
IMMATURE GRANULOCYTES: 0 %
LYMPHS: 43 %
Lymphocytes Absolute: 2.5 10*3/uL (ref 0.7–3.1)
MCH: 24.1 pg — ABNORMAL LOW (ref 26.6–33.0)
MCHC: 31.2 g/dL — ABNORMAL LOW (ref 31.5–35.7)
MCV: 77 fL — ABNORMAL LOW (ref 79–97)
Monocytes Absolute: 0.8 10*3/uL (ref 0.1–0.9)
Monocytes: 14 %
NEUTROS PCT: 40 %
Neutrophils Absolute: 2.3 10*3/uL (ref 1.4–7.0)
PLATELETS: 248 10*3/uL (ref 150–379)
RBC: 4.31 x10E6/uL (ref 4.14–5.80)
RDW: 18.4 % — AB (ref 12.3–15.4)
WBC: 5.7 10*3/uL (ref 3.4–10.8)

## 2015-10-17 LAB — T-HELPER CELLS (CD4) COUNT (NOT AT ARMC)
CD4 % Helper T Cell: 28 % — ABNORMAL LOW (ref 33–55)
CD4 T CELL ABS: 720 /uL (ref 400–2700)

## 2015-10-17 LAB — HIV-1 RNA QUANT-NO REFLEX-BLD
HIV-1 RNA Viral Load Log: 2.362 log10copy/mL
HIV-1 RNA Viral Load: 230 copies/mL

## 2015-10-17 NOTE — Progress Notes (Signed)
Internal Medicine Clinic Attending  Case discussed with Dr. Gill soon after the resident saw the patient.  We reviewed the resident's history and exam and pertinent patient test results.  I agree with the assessment, diagnosis, and plan of care documented in the resident's note.  

## 2015-10-17 NOTE — Telephone Encounter (Signed)
Mr. Eric Ford was referred to CSW for potential community resources for additional assistance.  CSW placed call to Mr. Eric Ford.  Pt states he is currently independent with his ADL's and is able to obtain his medications and is aware of what medications he takes and when.  Mr. Eric Ford receives transportation assistance from his sister and she visits "every now and then".  Pt is linked with [employer], receives grocery delivery 2 x month.  [facility] recently stopped providing pt free Ensure but continues to provide a limited amount of groceries during the month.  Pt states these are delivered to his home.  Mr. Eric Ford is not linked with [facility] for case management but he is aware he is able to contact [facility] for resources.  Pt states he attends medical appointment when he receives written documentation.  Pt's sister provides transportation to/from appointments.  Mr. Eric Ford voiced concern that he was to receive a leg brace but never did and is having increased difficulty walking for extended periods of time.  Pt states he was instructed to walk daily.  Mr. Eric Ford uses a cane and does not want to use a walker as he wants to remain as independent as possible.  CSW discussed the potential to have home health RN/PT evaluation for potential service needs.  Mr. Eric Ford would like to "hold off" at this time.  Pt informed to contact [facility] if he should need additional support services.  Mr. Eric Ford was able to recall [facility] phone number.  CSW will sign off, pt denies need for services.

## 2015-10-18 NOTE — Telephone Encounter (Signed)
Thanks for this information.

## 2015-10-19 ENCOUNTER — Telehealth: Payer: Self-pay | Admitting: *Deleted

## 2015-10-19 NOTE — Telephone Encounter (Signed)
-----   Message from Thayer Headings, MD sent at 10/19/2015  8:43 AM EDT ----- He missed his last appt but got labs by his PCP.  HIV Viral load 230.  Probably a processing issue.  I would like though to see him in May and repeat it then if possible to be sure if you can put him on my schedule or repeat the viral load at least (or both). Maybe repeat it the end of April and with me in May.    thanks

## 2015-10-19 NOTE — Telephone Encounter (Signed)
Left the pt a message.  Per Dr. Linus Salmons he would like the pt to repeat his HIV VL in April and make an appt with Dr Linus Salmons in May for follow-up.

## 2015-10-24 ENCOUNTER — Telehealth: Payer: Self-pay | Admitting: *Deleted

## 2015-10-24 NOTE — Telephone Encounter (Signed)
-----   Message from Thayer Headings, MD sent at 10/24/2015  3:38 PM EDT ----- He missed his last appt.  Can you have him follow up in June.  thanks

## 2015-10-24 NOTE — Telephone Encounter (Signed)
Made lab and follow up appointments, mailed reminder to patient at his request. Landis Gandy, RN

## 2015-11-24 ENCOUNTER — Telehealth: Payer: Self-pay | Admitting: Internal Medicine

## 2015-11-24 ENCOUNTER — Other Ambulatory Visit: Payer: Self-pay | Admitting: Internal Medicine

## 2015-11-24 NOTE — Telephone Encounter (Signed)
APPT. REMINDER CALL, LMTCB °

## 2015-11-27 ENCOUNTER — Encounter: Payer: Medicare Other | Admitting: Internal Medicine

## 2015-11-29 ENCOUNTER — Ambulatory Visit (INDEPENDENT_AMBULATORY_CARE_PROVIDER_SITE_OTHER): Payer: Medicare Other | Admitting: Internal Medicine

## 2015-11-29 ENCOUNTER — Encounter: Payer: Self-pay | Admitting: Internal Medicine

## 2015-11-29 VITALS — BP 122/61 | HR 62 | Temp 98.1°F | Ht 66.0 in | Wt 121.5 lb

## 2015-11-29 DIAGNOSIS — F17290 Nicotine dependence, other tobacco product, uncomplicated: Secondary | ICD-10-CM

## 2015-11-29 DIAGNOSIS — I739 Peripheral vascular disease, unspecified: Secondary | ICD-10-CM

## 2015-11-29 DIAGNOSIS — Z720 Tobacco use: Secondary | ICD-10-CM

## 2015-11-29 MED ORDER — VARENICLINE TARTRATE 0.5 MG X 11 & 1 MG X 42 PO MISC: ORAL | Status: DC

## 2015-11-29 MED ORDER — NICOTINE 14 MG/24HR TD PT24: 14.0000 mg | MEDICATED_PATCH | TRANSDERMAL | Status: DC

## 2015-11-29 NOTE — Progress Notes (Signed)
Patient ID: Eric Ford, male   DOB: 1955/10/03, 60 y.o.   MRN: [id number]     Subjective:   Patient ID: Eric Ford male    DOB: [birth year] 60 y.o.    MRN: [id number] Health Maintenance Due: Health Maintenance Due  Topic Date Due  . COLONOSCOPY  12/04/2013    _________________________________________________  HPI: Eric Ford is a 60 y.o. male here for an acute visit for a knee brace?  Pt has a PMH outlined below.  Please see problem-based charting assessment and plan for further status of patient's chronic medical problems addressed at today's visit.  PMH: Past Medical History  Diagnosis Date  . HIV infection ([place])   . Substance abuse     Medications: Current Outpatient Prescriptions on File Prior to Visit  Medication Sig Dispense Refill  . aspirin EC 81 MG tablet Take 1 tablet (81 mg total) by mouth daily. (Patient not taking: Reported on 08/09/2015) 100 tablet 3  . atorvastatin (LIPITOR) 80 MG tablet Take 1 tablet (80 mg total) by mouth at bedtime. 30 tablet 12  . camphor-menthol (SARNA) lotion Apply 1 application topically as needed for itching. 222 mL 0  . ENSURE (ENSURE) Take 1 Can by mouth 2 (two) times daily between meals. (Patient not taking: Reported on 08/09/2015) 237 mL 5  . ferrous sulfate 325 (65 FE) MG tablet Take 1 tablet (325 mg total) by mouth daily. 30 tablet 6  . GENVOYA 150-150-200-10 MG TABS tablet TAKE 1 TABLET BY MOUTH DAILY 30 tablet 1  . nicotine (NICODERM CQ - DOSED IN MG/24 HOURS) 14 mg/24hr patch Place 1 patch (14 mg total) onto the skin daily. 30 patch 0   No current facility-administered medications on file prior to visit.    Allergies: No Known Allergies  FH: Family History  Problem Relation Age of Onset  . Cancer Mother   . Heart disease Mother   . Hypertension Mother   . Heart attack Mother   . Hypertension Father   . Heart attack Father   . Cancer Sister   . Diabetes Sister   . Hyperlipidemia Sister   . Hypertension  Sister   . Peripheral vascular disease Sister   . AAA (abdominal aortic aneurysm) Sister   . Diabetes Brother   . Hypertension Brother   . Hypertension Son     SH: Social History   Social History  . Marital Status: Single    Spouse Name: N/A  . Number of Children: N/A  . Years of Education: N/A   Social History Main Topics  . Smoking status: Current Every Day Smoker    Types: Cigars    Last Attempt to Quit: 01/11/2009  . Smokeless tobacco: Never Used     Comment: smokes 3-4 cigars daily  . Alcohol Use: No     Comment: very seldom  . Drug Use: 7.00 per week    Special: Marijuana  . Sexual Activity:    Partners: Female     Comment: pt. given condoms   Other Topics Concern  . None   Social History Narrative    Review of Systems: Constitutional: Negative for fever, chills.  Eyes: Negative for blurred vision.  Respiratory: Negative for cough and shortness of breath.  Cardiovascular: Negative for chest pain.  Gastrointestinal: Negative for nausea, vomiting. Neurological: Negative for dizziness.   Objective:   Vital Signs: Filed Vitals:   11/29/15 1438  BP: 122/61  Pulse: 62  Temp: 98.1 F (36.7 C)  TempSrc:  Oral  Height: 5\' 6"  (1.676 m)  Weight: 121 lb 8 oz (55.112 kg)  SpO2: 100%      BP Readings from Last 3 Encounters:  11/29/15 122/61  10/16/15 131/71  08/23/15 121/63    Physical Exam: Constitutional: Vital signs reviewed.  Patient is in NAD and cooperative with exam.  Head: Normocephalic and atraumatic. Eyes: EOMI, conjunctivae nl, no scleral icterus.  Neck: Supple. Cardiovascular: RRR, no MRG. Pulmonary/Chest: Normal effort. Neurological: A&O x3, cranial nerves II-XII are grossly intact, moving all extremities. Extremities: No LE edema. Skin: Warm, dry and intact. No rash.   Assessment & Plan:   Assessment and plan was discussed and formulated with my attending.

## 2015-11-29 NOTE — Patient Instructions (Signed)
Thank you for your visit today.   Please return to the internal medicine clinic in about 6 month(s) or sooner if needed.     I have made the following additions/changes to your medications:  I have sent chantix for smoking cessation and the nicotine patch.   I advise you to stop smoking and using marijuana as this will only worsen your leg pain.   I would continue taking ferrous sulfate.  You do not need a   Please be sure to bring all of your medications with you to every visit; this includes herbal supplements, vitamins, eye drops, and any over-the-counter medications.   Should you have any questions regarding your medications and/or any new or worsening symptoms, please be sure to call the clinic at [phone].   If you believe that you are suffering from a life threatening condition or one that may result in the loss of limb or function, then you should call 911 and proceed to the nearest Emergency Department.   A healthy lifestyle and preventative care can promote health and wellness.   Maintain regular health, dental, and eye exams.  Eat a healthy diet. Foods like vegetables, fruits, whole grains, low-fat dairy products, and lean protein foods contain the nutrients you need without too many calories. Decrease your intake of foods high in solid fats, added sugars, and salt. Get information about a proper diet from your caregiver, if necessary.  Regular physical exercise is one of the most important things you can do for your health. Most adults should get at least 150 minutes of moderate-intensity exercise (any activity that increases your heart rate and causes you to sweat) each week. In addition, most adults need muscle-strengthening exercises on 2 or more days a week.   Maintain a healthy weight. The body mass index (BMI) is a screening tool to identify possible weight problems. It provides an estimate of body fat based on height and weight. Your caregiver can help determine  your BMI, and can help you achieve or maintain a healthy weight. For adults 20 years and older:  A BMI below 18.5 is considered underweight.  A BMI of 18.5 to 24.9 is normal.  A BMI of 25 to 29.9 is considered overweight.  A BMI of 30 and above is considered obese.

## 2015-11-29 NOTE — Assessment & Plan Note (Signed)
Pt reports smoking 4-5 black and mild per day.   -nicotine patch  -chantix

## 2015-11-29 NOTE — Assessment & Plan Note (Signed)
Pt is here to request a knee brace from a company that has been calling him.  I explained that he does not need any device such as a knee brace to constrict his arteries further with PAD.   -cont med mgmt for now

## 2015-12-04 NOTE — Progress Notes (Signed)
Internal Medicine Clinic Attending  Case discussed with Dr. Gill soon after the resident saw the patient.  We reviewed the resident's history and exam and pertinent patient test results.  I agree with the assessment, diagnosis, and plan of care documented in the resident's note.  

## 2016-01-15 ENCOUNTER — Encounter: Payer: Self-pay | Admitting: *Deleted

## 2016-01-15 ENCOUNTER — Other Ambulatory Visit: Payer: Medicare Other

## 2016-01-26 ENCOUNTER — Other Ambulatory Visit: Payer: Self-pay | Admitting: Infectious Diseases

## 2016-01-26 DIAGNOSIS — B20 Human immunodeficiency virus [HIV] disease: Secondary | ICD-10-CM

## 2016-01-29 ENCOUNTER — Ambulatory Visit: Payer: Medicare Other | Admitting: Internal Medicine

## 2016-01-29 ENCOUNTER — Telehealth: Payer: Self-pay | Admitting: *Deleted

## 2016-01-29 ENCOUNTER — Other Ambulatory Visit: Payer: Self-pay | Admitting: *Deleted

## 2016-01-29 DIAGNOSIS — B20 Human immunodeficiency virus [HIV] disease: Secondary | ICD-10-CM

## 2016-01-29 MED ORDER — DOLUTEGRAVIR SODIUM 50 MG PO TABS: 50.0000 mg | ORAL_TABLET | Freq: Every day | ORAL | Status: DC

## 2016-01-29 MED ORDER — EMTRICITABINE-TENOFOVIR AF 200-25 MG PO TABS: 1.0000 | ORAL_TABLET | Freq: Every day | ORAL | Status: DC

## 2016-01-29 NOTE — Telephone Encounter (Signed)
Patient called, needing assistance in genvoya refills.  Patient's last refill request was denied due to interaction with lipitor 80 mg.  Pt was supposed to come to see Dr. Linus Salmons today, forgot about appointment. He is rescheduled for 6/29.  Per Dr. Linus Salmons, patient needs to stay on the lipitor 80 mg. Verbal order per Dr. Linus Salmons to discontinue the [facility], new orders for Tivicay 50mg  and Descovy 200-25mg .  Sent new prescriptions to patient's pharmacy of choice.  Notified him of the change. He verbalized understanding, confirmed appointment Thursday. Landis Gandy, RN

## 2016-02-01 ENCOUNTER — Ambulatory Visit (INDEPENDENT_AMBULATORY_CARE_PROVIDER_SITE_OTHER): Payer: Medicare Other | Admitting: Internal Medicine

## 2016-02-01 ENCOUNTER — Encounter: Payer: Self-pay | Admitting: Internal Medicine

## 2016-02-01 VITALS — BP 118/69 | HR 50 | Temp 97.8°F | Ht 66.0 in | Wt 122.0 lb

## 2016-02-01 DIAGNOSIS — R636 Underweight: Secondary | ICD-10-CM | POA: Diagnosis not present

## 2016-02-01 DIAGNOSIS — B20 Human immunodeficiency virus [HIV] disease: Secondary | ICD-10-CM

## 2016-02-01 DIAGNOSIS — I70213 Atherosclerosis of native arteries of extremities with intermittent claudication, bilateral legs: Secondary | ICD-10-CM

## 2016-02-01 DIAGNOSIS — B182 Chronic viral hepatitis C: Secondary | ICD-10-CM

## 2016-02-01 DIAGNOSIS — Z113 Encounter for screening for infections with a predominantly sexual mode of transmission: Secondary | ICD-10-CM | POA: Diagnosis not present

## 2016-02-01 NOTE — Assessment & Plan Note (Signed)
Eating better and some weight gain

## 2016-02-01 NOTE — Progress Notes (Signed)
  Subjective:    Patient ID: Eric Ford, male    DOB: 01/07/1956, 60 y.o.   MRN: [id number]  HPI Here for routine follow up for HIV.  I last saw him in September and had been on Atripla with a good CD4 and continued undetectable viral load last visit.  I chenged him then to [facility] and he did well with that but earlier this week noted he was on 80 mg atorvastatin for PAD.  I then changed him to [place] and Descovy and he has been on this and doing well.  No missed doses. Better weight gain.  Does get food from [facility].  Has been treated for hep C and SVR24 negative confirming cure.  F0/1.  Feels well.    Review of Systems  Constitutional: Negative for fatigue.  HENT: Negative for sore throat and trouble swallowing.   Gastrointestinal: Negative for diarrhea.  Musculoskeletal: Negative for myalgias and arthralgias.  Skin: Negative for rash.  Neurological: Negative for light-headedness and headaches.  Psychiatric/Behavioral: Negative for dysphoric mood.       Objective:   Physical Exam  Constitutional: He appears well-developed and well-nourished. No distress.  HENT:  Mouth/Throat: No oropharyngeal exudate.  Eyes: No scleral icterus.  Cardiovascular: Normal rate, regular rhythm and normal heart sounds.   No murmur heard. Lymphadenopathy:    He has no cervical adenopathy.  Skin: No rash noted.    Social History   Social History  . Marital Status: Single    Spouse Name: N/A  . Number of Children: N/A  . Years of Education: N/A   Occupational History  . Not on file.   Social History Main Topics  . Smoking status: Current Every Day Smoker    Types: Cigars    Last Attempt to Quit: 01/11/2009  . Smokeless tobacco: Never Used     Comment: smokes 3-4 cigars daily  . Alcohol Use: No     Comment: very seldom  . Drug Use: 7.00 per week    Special: Marijuana  . Sexual Activity:    Partners: Female     Comment: pt. given condoms   Other Topics Concern  . Not on file    Social History Narrative        Assessment & Plan:

## 2016-02-01 NOTE — Assessment & Plan Note (Signed)
Some detectable virus last time so will recheck today.  If ok, rtc 4 months.

## 2016-02-01 NOTE — Assessment & Plan Note (Signed)
Now considered cured.  

## 2016-02-02 LAB — HIV-1 RNA ULTRAQUANT REFLEX TO GENTYP+
HIV 1 RNA Quant: <20 copies/mL (ref <20)
HIV-1 RNA Quant, Log: <1.3 Log copies/mL (ref <1.30)

## 2016-03-04 ENCOUNTER — Telehealth: Payer: Self-pay

## 2016-03-04 NOTE — Telephone Encounter (Signed)
Patient called stating that he has not received his 3rd pill white HIV pill from his pharmacy. Patient could not identify medication he was missing. Explained to patient his mediation was changed per Dr.Comer at last refill on the 26th and he is no longer taking Genvoya. Patient stated understanding.   LPN called pharmacy to verify what medication patient is being given. [facility] pharmacy employee Herbie Baltimore identified patient is currently taking Tivicay and Descovy. Rodman Key, LPN

## 2016-03-15 IMAGING — US US ABDOMEN COMPLETE W/ ELASTOGRAPHY
1 series · 13 of 25 positions shown · non-contrast
Comparison: None.

CLINICAL DATA: Evaluate fibrosis of the liver



[Series 1: us abdomen complete w/ elastography · 0.22mm/px · 13 of 76 slices shown]
[im 1/76]
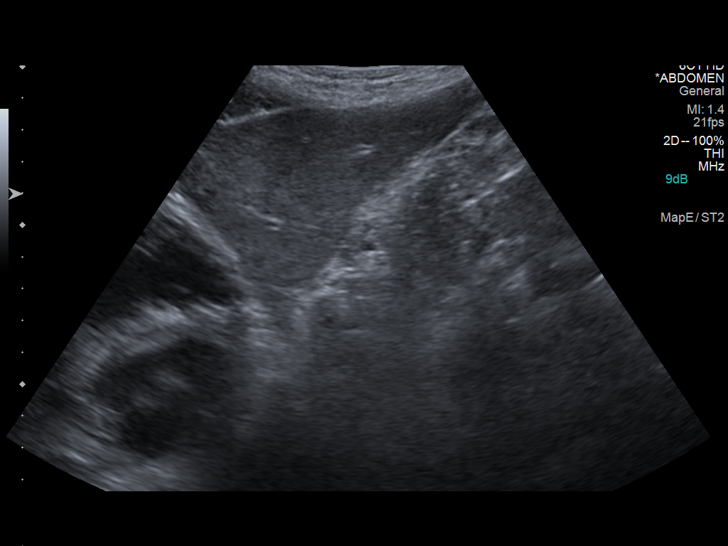
[im 7/76]
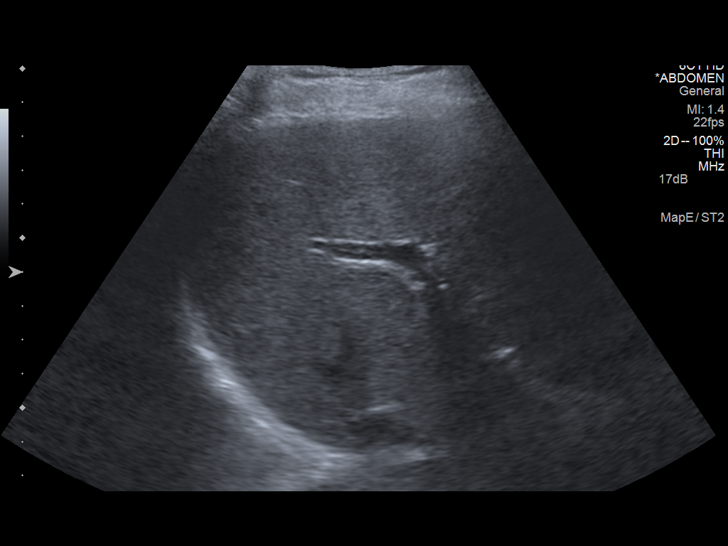
[im 13/76]
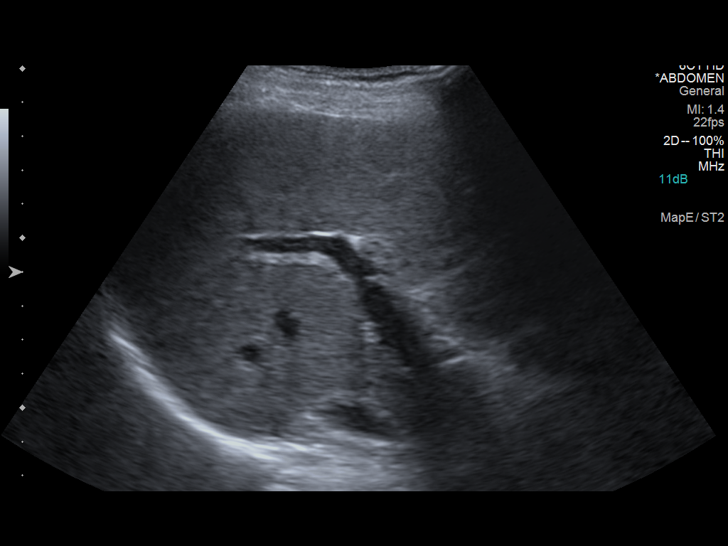
[im 19/76]
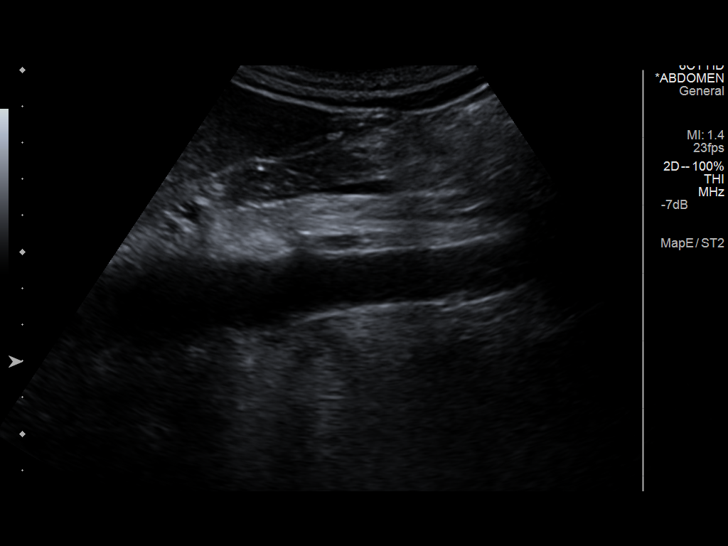
[im 26/76]
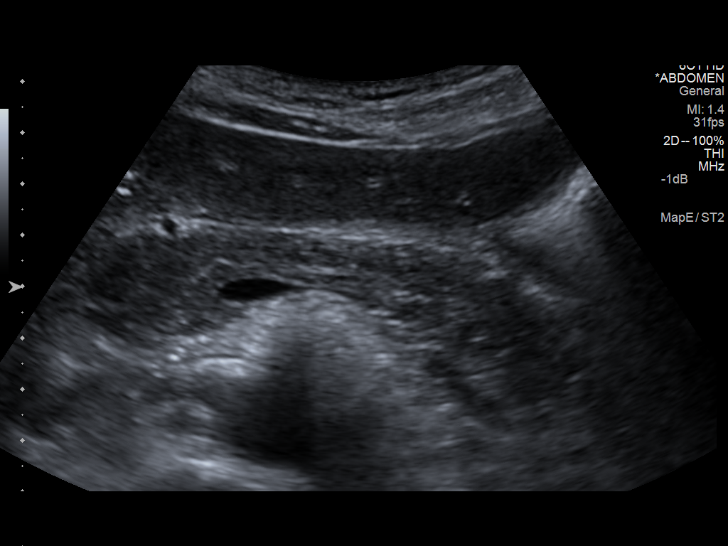
[im 32/76]
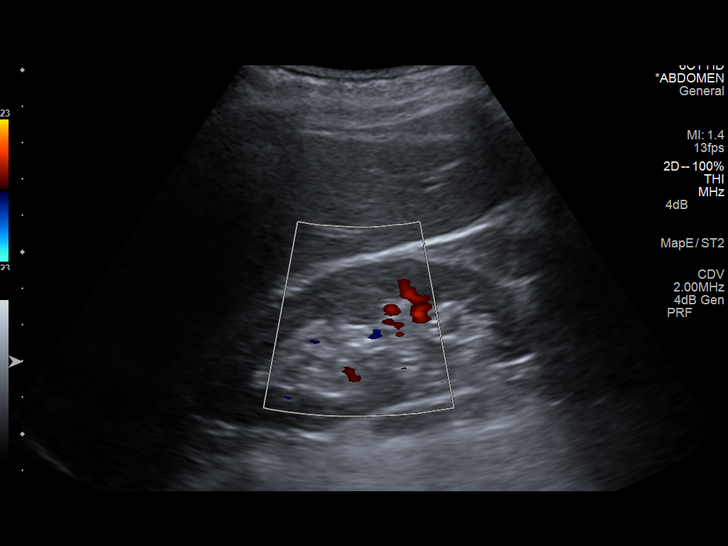
[im 38/76]
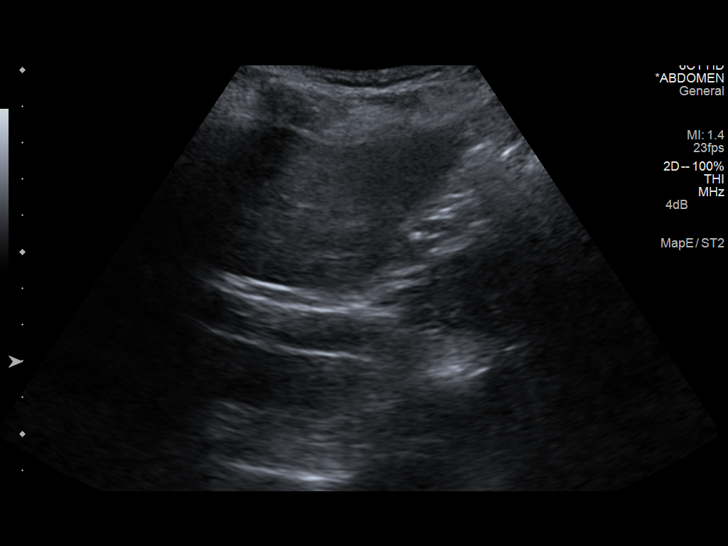
[im 44/76]
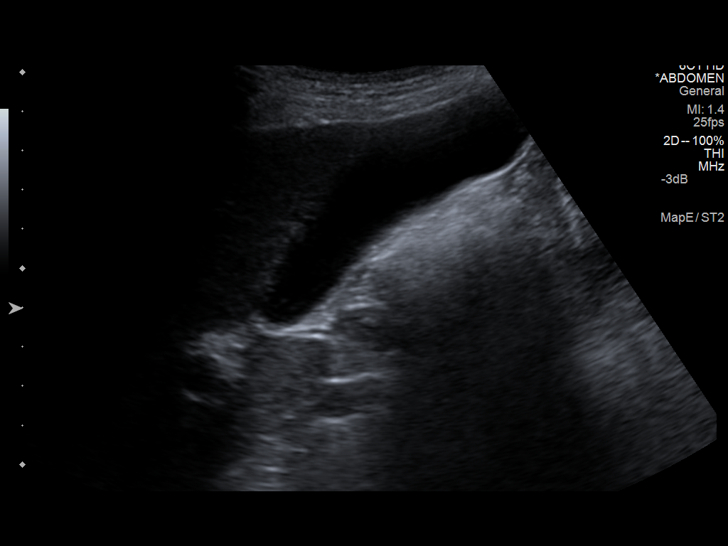
[im 51/76]
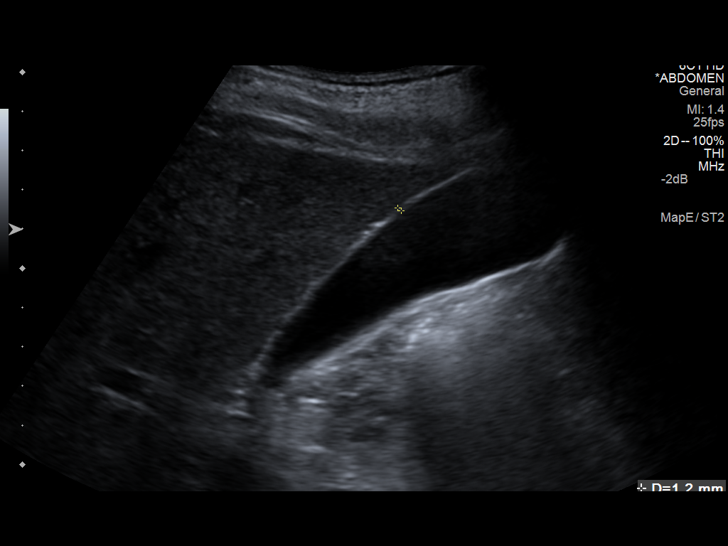
[im 57/76]
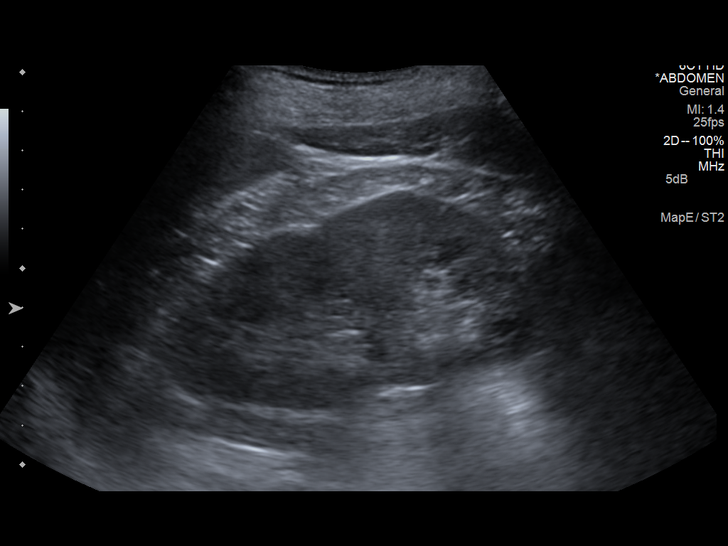
[im 63/76]
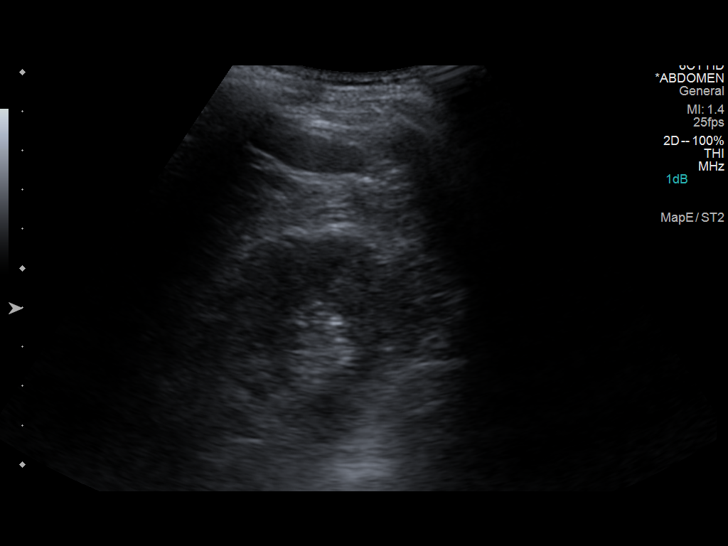
[im 69/76]
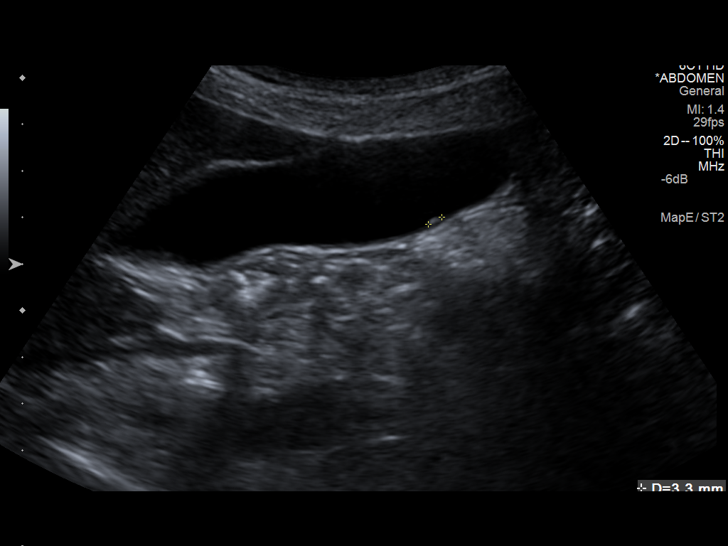
[im 76/76]
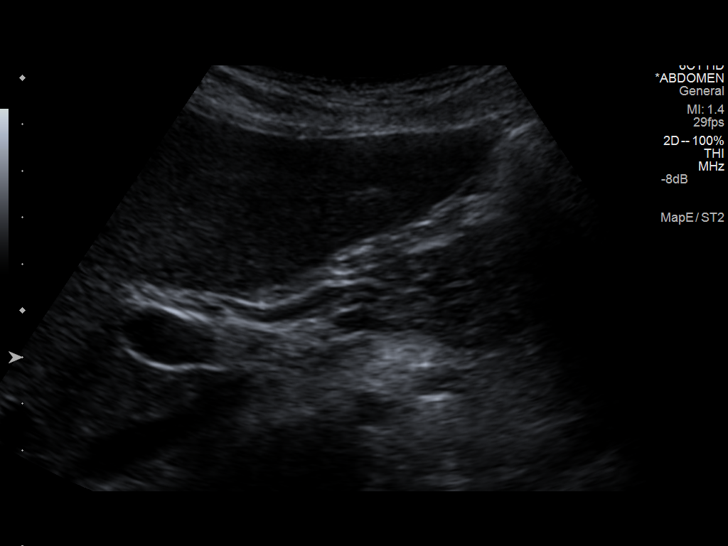

[13 of 25 positions shown; findings below may reference images not displayed]

FINDINGS: ULTRASOUND ABDOMEN

Gallbladder: Small polyp is identified within the gallbladder. No
gallbladder wall thickening or pericholecystic fluid. Negative
sonographic Murphy's sign.

Common bile duct: Diameter: 3.4 mm

Liver: No focal lesion identified. Within normal limits in
parenchymal echogenicity.

IVC: No abnormality visualized.

Pancreas: Visualized portion unremarkable.

Spleen: Size and appearance within normal limits.

Right Kidney: Length: 10.6 cm. Echogenicity within normal limits. No
mass or hydronephrosis visualized.

Left Kidney: Length: 10.2 cm. Echogenicity within normal limits. No
mass or hydronephrosis visualized.

Abdominal aorta: No aneurysm visualized.

Other findings: None.

ULTRASOUND HEPATIC ELASTOGRAPHY

Device: Siemens Helix VTQ

Transducer 4V 1

Patient position: 10

Number of measurements:  8

Hepatic Segment:  8

Median velocity:   1.17  m/sec

IQR:

IQR/Median velocity ratio

Corresponding Metavir fibrosis score:  F 0/F 1

Risk of fibrosis: Minimal

Limitations of exam: None

Pertinent findings noted on other imaging exams:  None

Please note that abnormal shear wave velocities may also be
identified in clinical settings other than with hepatic fibrosis,
such as: acute hepatitis, elevated right heart and central venous
pressures including use of beta blockers, Monplaisir disease
([place]), infiltrative processes such as
mastocytosis/amyloidosis/infiltrative tumor, extrahepatic
cholestasis, in the post-prandial state, and liver transplantation.
Correlation with patient history, laboratory data, and clinical
condition recommended.
IMPRESSION: 1. Essentially normal abdominal sonogram.
2. Gallbladder polyp.

Median hepatic shear wave velocity is calculated at 1.17 m/sec.

Corresponding Metavir fibrosis score is F 0/F 1.

Risk of fibrosis is minimal.

Follow-up:  None

## 2016-03-15 IMAGING — US US ABDOMEN COMPLETE W/ ELASTOGRAPHY
1 series · 12 of 12 positions shown · non-contrast
Comparison: None.

CLINICAL DATA: Evaluate fibrosis of the liver



[Series 1: us abdomen complete w/ elastography · 0.13mm/px · 12 of 12 slices shown]
[im 1/12]
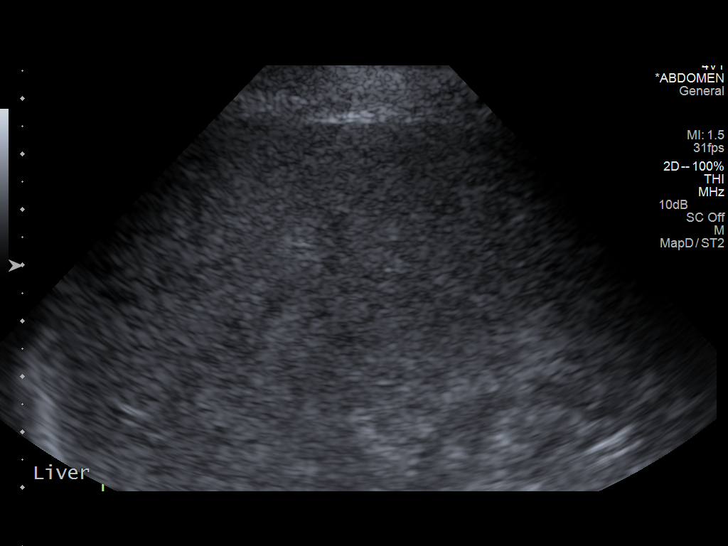
[im 2/12]
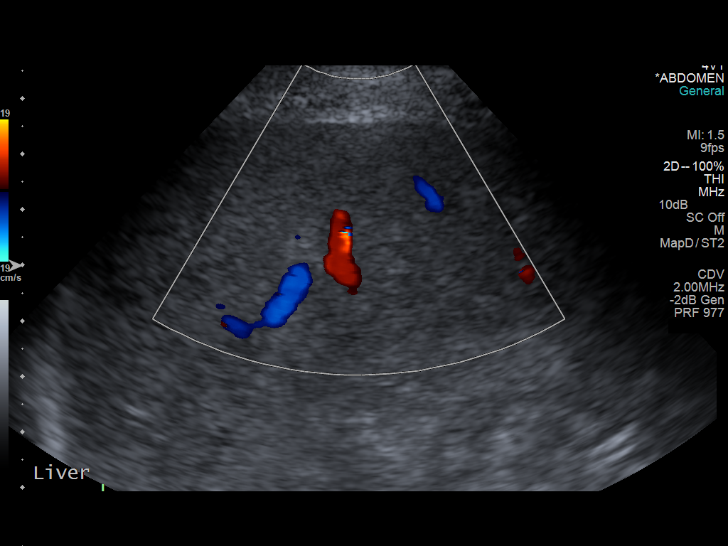
[im 3/12]
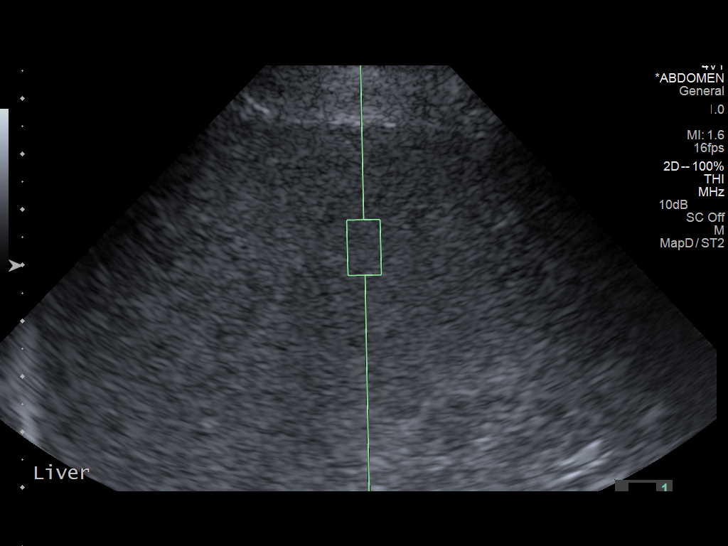
[im 4/12]
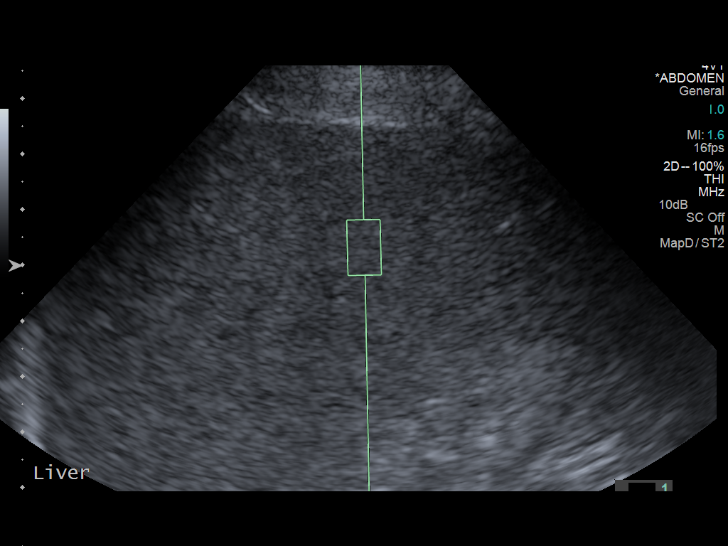
[im 5/12]
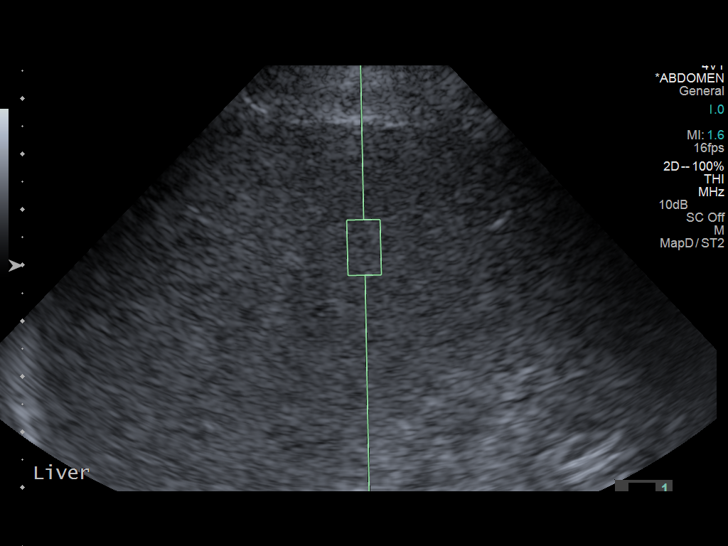
[im 6/12]
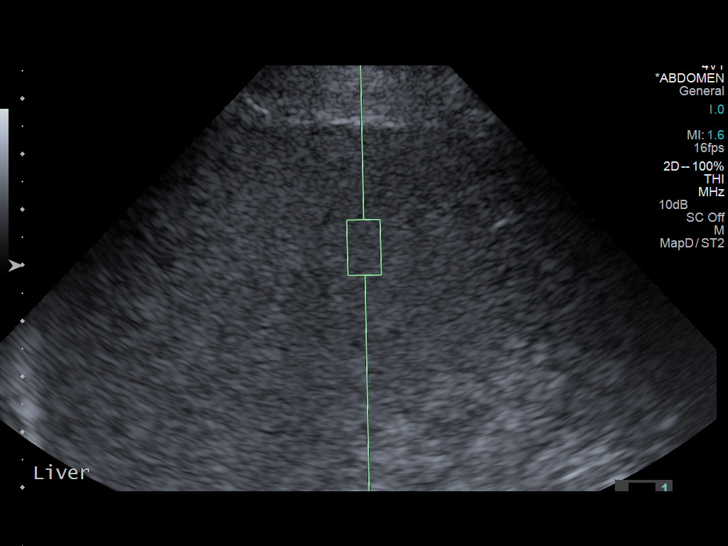
[im 7/12]
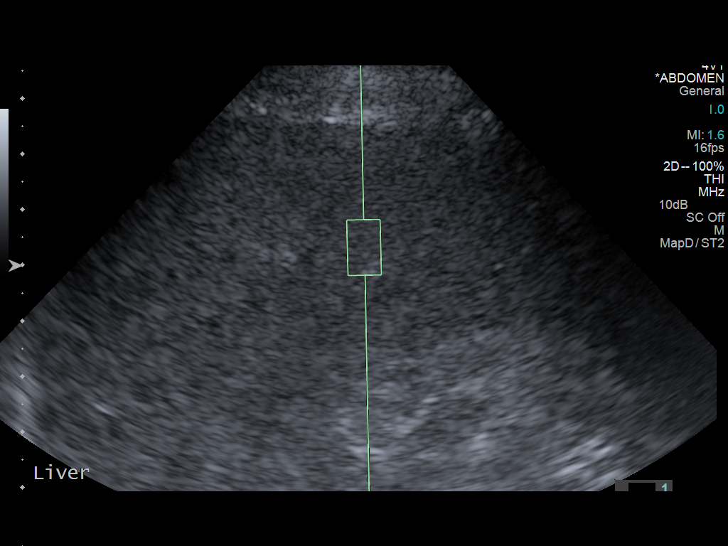
[im 8/12]
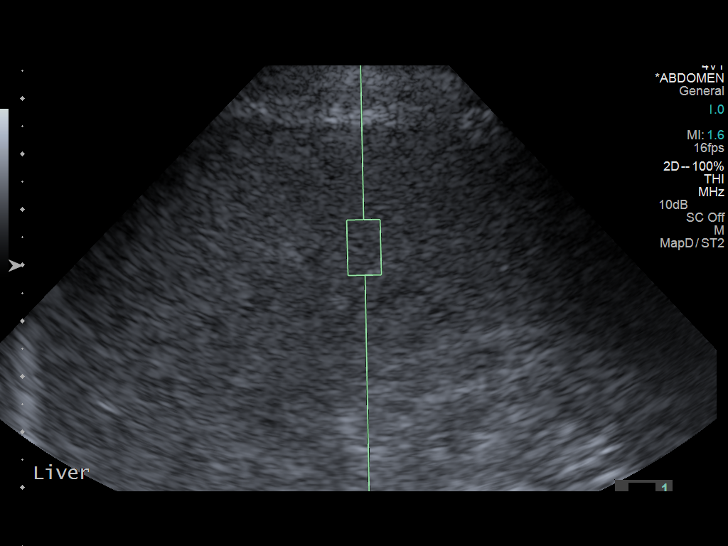
[im 9/12]
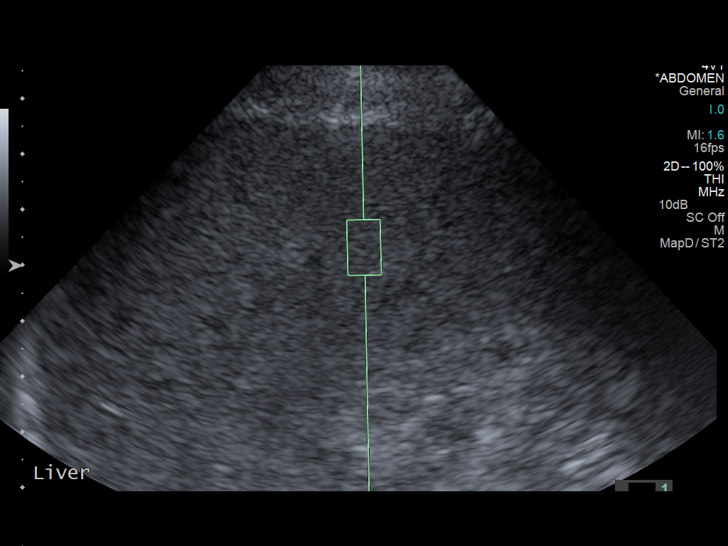
[im 10/12]
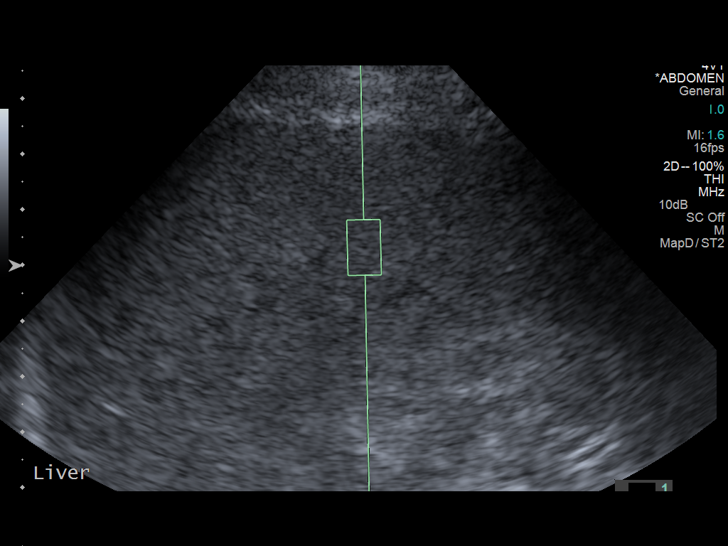
[im 11/12]
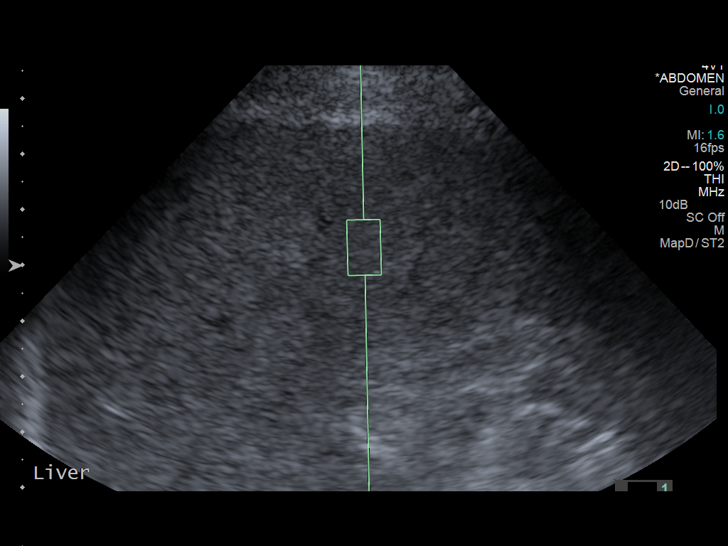
[im 12/12]
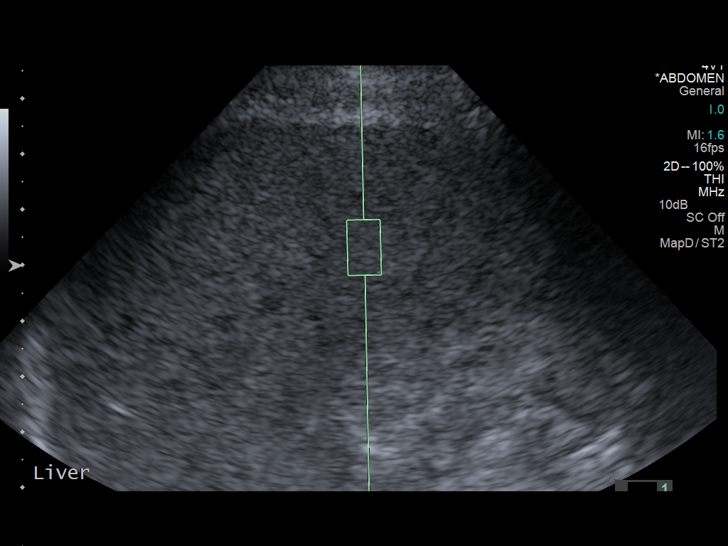

[12 of 12 positions shown; findings below may reference images not displayed]

FINDINGS: ULTRASOUND ABDOMEN

Gallbladder: Small polyp is identified within the gallbladder. No
gallbladder wall thickening or pericholecystic fluid. Negative
sonographic Murphy's sign.

Common bile duct: Diameter: 3.4 mm

Liver: No focal lesion identified. Within normal limits in
parenchymal echogenicity.

IVC: No abnormality visualized.

Pancreas: Visualized portion unremarkable.

Spleen: Size and appearance within normal limits.

Right Kidney: Length: 10.6 cm. Echogenicity within normal limits. No
mass or hydronephrosis visualized.

Left Kidney: Length: 10.2 cm. Echogenicity within normal limits. No
mass or hydronephrosis visualized.

Abdominal aorta: No aneurysm visualized.

Other findings: None.

ULTRASOUND HEPATIC ELASTOGRAPHY

Device: Siemens Helix VTQ

Transducer 4V 1

Patient position: 10

Number of measurements:  8

Hepatic Segment:  8

Median velocity:   1.17  m/sec

IQR:

IQR/Median velocity ratio

Corresponding Metavir fibrosis score:  F 0/F 1

Risk of fibrosis: Minimal

Limitations of exam: None

Pertinent findings noted on other imaging exams:  None

Please note that abnormal shear wave velocities may also be
identified in clinical settings other than with hepatic fibrosis,
such as: acute hepatitis, elevated right heart and central venous
pressures including use of beta blockers, Monplaisir disease
([place]), infiltrative processes such as
mastocytosis/amyloidosis/infiltrative tumor, extrahepatic
cholestasis, in the post-prandial state, and liver transplantation.
Correlation with patient history, laboratory data, and clinical
condition recommended.
IMPRESSION: 1. Essentially normal abdominal sonogram.
2. Gallbladder polyp.

Median hepatic shear wave velocity is calculated at 1.17 m/sec.

Corresponding Metavir fibrosis score is F 0/F 1.

Risk of fibrosis is minimal.

Follow-up:  None

## 2016-05-09 ENCOUNTER — Other Ambulatory Visit: Payer: Medicare Other

## 2016-05-09 DIAGNOSIS — B20 Human immunodeficiency virus [HIV] disease: Secondary | ICD-10-CM

## 2016-05-09 DIAGNOSIS — Z113 Encounter for screening for infections with a predominantly sexual mode of transmission: Secondary | ICD-10-CM

## 2016-05-09 LAB — CBC WITH DIFFERENTIAL/PLATELET
BASOS ABS: 41 {cells}/uL (ref 0–200)
BASOS PCT: 1 %
EOS PCT: 3 %
Eosinophils Absolute: 123 cells/uL (ref 15–500)
HCT: 39.1 % (ref 38.5–50.0)
HEMOGLOBIN: 12.5 g/dL — AB (ref 13.2–17.1)
LYMPHS ABS: 1968 {cells}/uL (ref 850–3900)
Lymphocytes Relative: 48 %
MCH: 24.6 pg — AB (ref 27.0–33.0)
MCHC: 32 g/dL (ref 32.0–36.0)
MCV: 76.8 fL — ABNORMAL LOW (ref 80.0–100.0)
MONOS PCT: 10 %
MPV: 10.5 fL (ref 7.5–12.5)
Monocytes Absolute: 410 cells/uL (ref 200–950)
NEUTROS ABS: 1558 {cells}/uL (ref 1500–7800)
Neutrophils Relative %: 38 %
PLATELETS: 215 10*3/uL (ref 140–400)
RBC: 5.09 MIL/uL (ref 4.20–5.80)
RDW: 19.2 % — ABNORMAL HIGH (ref 11.0–15.0)
WBC: 4.1 10*3/uL (ref 3.8–10.8)

## 2016-05-09 LAB — COMPLETE METABOLIC PANEL WITH GFR
ALK PHOS: 78 U/L (ref 40–115)
ALT: 16 U/L (ref 9–46)
AST: 26 U/L (ref 10–35)
Albumin: 3.9 g/dL (ref 3.6–5.1)
BILIRUBIN TOTAL: 0.4 mg/dL (ref 0.2–1.2)
BUN: 15 mg/dL (ref 7–25)
CHLORIDE: 109 mmol/L (ref 98–110)
CO2: 20 mmol/L (ref 20–31)
Calcium: 8.8 mg/dL (ref 8.6–10.3)
Creat: 1.2 mg/dL (ref 0.70–1.25)
GFR, EST AFRICAN AMERICAN: 74 mL/min (ref >=60)
GFR, EST NON AFRICAN AMERICAN: 64 mL/min (ref >=60)
GLUCOSE: 93 mg/dL (ref 65–99)
Potassium: 4.1 mmol/L (ref 3.5–5.3)
Sodium: 140 mmol/L (ref 135–146)
TOTAL PROTEIN: 7.5 g/dL (ref 6.1–8.1)

## 2016-05-10 LAB — HIV-1 RNA QUANT-NO REFLEX-BLD

## 2016-05-10 LAB — T-HELPER CELL (CD4) - (RCID CLINIC ONLY)
CD4 T CELL ABS: 510 /uL (ref 400–2700)
CD4 T CELL HELPER: 25 % — AB (ref 33–55)

## 2016-05-10 LAB — RPR

## 2016-05-23 ENCOUNTER — Encounter: Payer: Self-pay | Admitting: Internal Medicine

## 2016-05-23 ENCOUNTER — Ambulatory Visit (INDEPENDENT_AMBULATORY_CARE_PROVIDER_SITE_OTHER): Payer: Medicare Other | Admitting: Internal Medicine

## 2016-05-23 VITALS — HR 58 | Temp 97.7°F | Wt 133.0 lb

## 2016-05-23 DIAGNOSIS — R636 Underweight: Secondary | ICD-10-CM | POA: Diagnosis not present

## 2016-05-23 DIAGNOSIS — E785 Hyperlipidemia, unspecified: Secondary | ICD-10-CM

## 2016-05-23 DIAGNOSIS — Z720 Tobacco use: Secondary | ICD-10-CM

## 2016-05-23 DIAGNOSIS — I739 Peripheral vascular disease, unspecified: Secondary | ICD-10-CM

## 2016-05-23 DIAGNOSIS — Z23 Encounter for immunization: Secondary | ICD-10-CM | POA: Diagnosis not present

## 2016-05-23 DIAGNOSIS — B20 Human immunodeficiency virus [HIV] disease: Secondary | ICD-10-CM | POA: Diagnosis not present

## 2016-05-23 MED ORDER — DIPHENHYDRAMINE HCL 25 MG PO CAPS: 25.0000 mg | ORAL_CAPSULE | Freq: Every evening | ORAL | 1 refills | Status: DC | PRN

## 2016-05-23 MED ORDER — NICOTINE 14 MG/24HR TD PT24: 14.0000 mg | MEDICATED_PATCH | Freq: Every day | TRANSDERMAL | 0 refills | Status: DC

## 2016-05-23 NOTE — Assessment & Plan Note (Signed)
Doing better now and has access to food with [facility].

## 2016-05-23 NOTE — Assessment & Plan Note (Signed)
On atorvastatin high dose so will avoid a booster

## 2016-05-23 NOTE — Assessment & Plan Note (Signed)
Doing well on tivicay and descovy.  rtc 6 months

## 2016-05-23 NOTE — Assessment & Plan Note (Signed)
Advised to quit smoking and will try the patches.  I will send to his pharmacy

## 2016-05-23 NOTE — Progress Notes (Signed)
  Subjective:    Patient ID: Eric Ford, male    DOB: March 08, 1956, 60 y.o.   MRN: [id number]  HPI Here for routine follow up for HIV.    I changed him to [place] and Descovy and he is taking well, no missed doses. Better weight gain and is up over 20 lbs.  Does get food from [facility].  Has been treated for hep C and SVR24 negative confirming cure.  F0/1.  Feels well.  Still smoking but interested in quiting.    Review of Systems  Constitutional: Negative for fatigue.  HENT: Negative for sore throat and trouble swallowing.   Gastrointestinal: Negative for diarrhea.  Musculoskeletal: Negative for arthralgias and myalgias.  Skin: Negative for rash.  Neurological: Negative for light-headedness and headaches.  Psychiatric/Behavioral: Negative for dysphoric mood.       Objective:   Physical Exam  Constitutional: He appears well-developed and well-nourished. No distress.  HENT:  Mouth/Throat: No oropharyngeal exudate.  Eyes: No scleral icterus.  Cardiovascular: Normal rate, regular rhythm and normal heart sounds.   No murmur heard. Lymphadenopathy:    He has no cervical adenopathy.  Skin: No rash noted.    Social History   Social History  . Marital status: Single    Spouse name: N/A  . Number of children: N/A  . Years of education: N/A   Occupational History  . Not on file.   Social History Main Topics  . Smoking status: Current Every Day Smoker    Types: Cigars    Last attempt to quit: 01/11/2009  . Smokeless tobacco: Never Used     Comment: smokes 3-4 cigars daily  . Alcohol use No     Comment: very seldom  . Drug use:     Frequency: 7.0 times per week    Types: Marijuana  . Sexual activity: Yes    Partners: Female     Comment: pt. given condoms   Other Topics Concern  . Not on file   Social History Narrative  . No narrative on file        Assessment & Plan:

## 2016-07-25 ENCOUNTER — Other Ambulatory Visit: Payer: Self-pay | Admitting: Internal Medicine

## 2016-07-26 ENCOUNTER — Encounter: Payer: Self-pay | Admitting: Family

## 2016-08-08 ENCOUNTER — Ambulatory Visit (HOSPITAL_COMMUNITY)
Admission: RE | Admit: 2016-08-08 | Discharge: 2016-08-08 | Disposition: A | Discharge status: Home / Self Care | Payer: Medicare HMO | Source: Ambulatory Visit | Attending: Family | Admitting: Family

## 2016-08-08 ENCOUNTER — Encounter: Payer: Self-pay | Admitting: Family

## 2016-08-08 ENCOUNTER — Ambulatory Visit (INDEPENDENT_AMBULATORY_CARE_PROVIDER_SITE_OTHER): Payer: Medicare HMO | Admitting: Family

## 2016-08-08 VITALS — BP 102/60 | HR 68 | Temp 97.8°F | Resp 22 | Ht 66.0 in | Wt 138.0 lb

## 2016-08-08 DIAGNOSIS — I70213 Atherosclerosis of native arteries of extremities with intermittent claudication, bilateral legs: Secondary | ICD-10-CM

## 2016-08-08 NOTE — Patient Instructions (Signed)
Intermittent Claudication Intermittent claudication is pain in your leg that occurs when you walk or exercise and goes away when you rest. The pain can occur in one or both legs. CAUSES Intermittent claudication is caused by the buildup of plaque within the major arteries in the body (atherosclerosis). The plaque, which makes arteries stiff and narrow, prevents enough blood from reaching your leg muscles. The pain occurs when you walk or exercise because your muscles need more blood when you are moving and exercising. RISK FACTORS Risk factors include:  A family history of atherosclerosis.  A personal history of stroke or heart disease.  Older age.  Being inactive or overweight.  Smoking cigarettes.  Having another health condition such as:  Diabetes.  High blood pressure.  High cholesterol. SIGNS AND SYMPTOMS  Your hip or leg may:   Ache.  Cramp.  Feel tight.  Feel weak.  Feel heavy. Over time, you may feel pain in your calf, thigh, or hip. DIAGNOSIS  Your health care provider may diagnose intermittent claudication based on your symptoms and medical history. Your health care provider may also do tests to learn more about your condition. These may include:  Blood tests.  An ultrasound.  Imaging tests such as angiography, magnetic resonance angiography (MRA), and computed tomography angiography (CTA). TREATMENT You may be treated for problems such as:  High blood pressure.  High cholesterol.  Diabetes. Other treatments may include:  Lifestyle changes such as:  Starting an exercise program.  Losing weight.  Quitting smoking.  Medicines to help restore blood flow through your legs.  Blood vessel surgery (angioplasty) to restore blood flow if your intermittent claudication is caused by severe peripheral artery disease. HOME CARE INSTRUCTIONS  Manage any other health conditions you have.  Eat a diet low in saturated fats and calories to maintain a  healthy weight.  Quit smoking, if you smoke.  Take medicines only as directed by your health care provider.  If your health care provider recommended an exercise program for you, follow it as directed. Your exercise program may involve:  Walking three or more times a week.  Walking until you have certain symptoms of intermittent claudication.  Resting until symptoms go away.  Gradually increasing walking time to about 50 minutes a day. SEEK MEDICAL CARE IF: Your condition is not getting better or is getting worse. SEEK IMMEDIATE MEDICAL CARE IF:   You have chest pain.  You have difficulty breathing.  You develop arm weakness.  You have trouble speaking.  Your face begins to droop. MAKE SURE YOU:  Understand these instructions.  Will watch your condition.  Will get help if you are not doing well or get worse. This information is not intended to replace advice given to you by your health care provider. Make sure you discuss any questions you have with your health care provider. Document Released: 05/24/2004 Document Revised: 08/12/2014 Document Reviewed: 10/28/2013 Elsevier Interactive Patient Education  2017 Elsevier Inc.      Steps to Quit Smoking Smoking tobacco can be bad for your health. It can also affect almost every organ in your body. Smoking puts you and people around you at risk for many serious long-lasting (chronic) diseases. Quitting smoking is hard, but it is one of the best things that you can do for your health. It is never too late to quit. What are the benefits of quitting smoking? When you quit smoking, you lower your risk for getting serious diseases and conditions. They can include:    Lung cancer or lung disease.  Heart disease.  Stroke.  Heart attack.  Not being able to have children (infertility).  Weak bones (osteoporosis) and broken bones (fractures). If you have coughing, wheezing, and shortness of breath, those symptoms may get  better when you quit. You may also get sick less often. If you are pregnant, quitting smoking can help to lower your chances of having a baby of low birth weight. What can I do to help me quit smoking? Talk with your doctor about what can help you quit smoking. Some things you can do (strategies) include:  Quitting smoking totally, instead of slowly cutting back how much you smoke over a period of time.  Going to in-person counseling. You are more likely to quit if you go to many counseling sessions.  Using resources and support systems, such as:  Online chats with a counselor.  Phone quitlines.  Printed self-help materials.  Support groups or group counseling.  Text messaging programs.  Mobile phone apps or applications.  Taking medicines. Some of these medicines may have nicotine in them. If you are pregnant or breastfeeding, do not take any medicines to quit smoking unless your doctor says it is okay. Talk with your doctor about counseling or other things that can help you. Talk with your doctor about using more than one strategy at the same time, such as taking medicines while you are also going to in-person counseling. This can help make quitting easier. What things can I do to make it easier to quit? Quitting smoking might feel very hard at first, but there is a lot that you can do to make it easier. Take these steps:  Talk to your family and friends. Ask them to support and encourage you.  Call phone quitlines, reach out to support groups, or work with a counselor.  Ask people who smoke to not smoke around you.  Avoid places that make you want (trigger) to smoke, such as:  Bars.  Parties.  Smoke-break areas at work.  Spend time with people who do not smoke.  Lower the stress in your life. Stress can make you want to smoke. Try these things to help your stress:  Getting regular exercise.  Deep-breathing exercises.  Yoga.  Meditating.  Doing a body scan. To  do this, close your eyes, focus on one area of your body at a time from head to toe, and notice which parts of your body are tense. Try to relax the muscles in those areas.  Download or buy apps on your mobile phone or tablet that can help you stick to your quit plan. There are many free apps, such as QuitGuide from the CDC (Centers for Disease Control and Prevention). You can find more support from smokefree.gov and other websites. This information is not intended to replace advice given to you by your health care provider. Make sure you discuss any questions you have with your health care provider. Document Released: 05/18/2009 Document Revised: 03/19/2016 Document Reviewed: 12/06/2014 Elsevier Interactive Patient Education  2017 Elsevier Inc.  

## 2016-08-08 NOTE — Progress Notes (Signed)
CC: Follow up Intermittent Claudication  History of Present Illness  Eric Ford is a 61 y.o. ([birth year]) male  patient of Dr. Oneida Alar who presents for follow up for bilateral calf claudication.   Today, he says he is able to walk a block without stopping. He usually walks about 4 blocks every morning.   On ROS today: he denies any rest pain and non healing wounds.   The pt was last evaluated by Dr. Oneida Alar on 06/19/14. At that time his claudication wais not lifestyle limiting and he was satisfied with his walking distance. His ABIs were reasonable and he was not at risk of limb loss. Dr. Oneida Alar advised the pt to continue a walking program of 30 minutes daily and continue his aspirin. Dr. Oneida Alar discussed with him stopping smoking marijuana. He was to follow-up in 6 months time with repeat ABIs.  He states he feels light headed after walking or standing for about 30 minutes and he reports a syncopal episode about November 2016 in which he fell and injured an eye orbit; he had a hx of hypotension. Since then the HCTZ was stopped.  He states he drinks at least 8 cups of fluid per day.  Pt Diabetic: No Pt smoker: smoker  (1/3 ppd x started in his teens)  Pt meds include: Statin :Yes Betablocker: No ASA: Yes Other anticoagulants/antiplatelets: no   Past Medical History:  Diagnosis Date  . HIV infection ([place])   . Substance abuse     Social History Social History  Substance Use Topics  . Smoking status: Current Every Day Smoker    Types: Cigars    Last attempt to quit: 01/11/2009  . Smokeless tobacco: Never Used     Comment: smokes 3-4 cigars daily  . Alcohol use No     Comment: very seldom    Family History Family History  Problem Relation Age of Onset  . Cancer Mother   . Heart disease Mother   . Hypertension Mother   . Heart attack Mother   . Hypertension Father   . Heart attack Father   . Cancer Sister   . Diabetes Sister   . Hyperlipidemia Sister   .  Hypertension Sister   . Peripheral vascular disease Sister   . AAA (abdominal aortic aneurysm) Sister   . Diabetes Brother   . Hypertension Brother   . Hypertension Son     Surgical History Past Surgical History:  Procedure Laterality Date  . COLONOSCOPY    . COLONOSCOPY     done years [facility]  . POLYPECTOMY    . TRUNK WOUND REPAIR / CLOSURE      No Known Allergies  Current Outpatient Prescriptions  Medication Sig Dispense Refill  . aspirin EC 81 MG tablet Take 1 tablet (81 mg total) by mouth daily. 100 tablet 3  . atorvastatin (LIPITOR) 80 MG tablet Take 1 tablet (80 mg total) by mouth at bedtime. 30 tablet 12  . diphenhydrAMINE (BENADRYL) 25 mg capsule Take 1 capsule (25 mg total) by mouth at bedtime as needed for itching. 30 capsule 1  . emtricitabine-tenofovir AF (DESCOVY) 200-25 MG tablet Take 1 tablet by mouth daily. 30 tablet 5  . ferrous sulfate 325 (65 FE) MG tablet Take 1 tablet (325 mg total) by mouth daily. (Patient not taking: Reported on 05/23/2016) 30 tablet 6  . nicotine ([employer] NICOTINE TRANSDERMAL SYS) 14 mg/24hr patch Place 1 patch (14 mg total) onto the skin daily. 28 patch  0  . TIVICAY 50 MG tablet TAKE 1 TABLET BY MOUTH DAILY(DISCONTINUE GENVOYA) 30 tablet 5   No current facility-administered medications for this visit.     REVIEW OF SYSTEMS: see HPI for pertinent positives and negatives    Physical Examination  Vitals:   08/08/16 1231  BP: 102/60  Pulse: 68  Resp: (!) 22  Temp: 97.8 F (36.6 C)  TempSrc: Oral  SpO2: 98%  Weight: 138 lb (62.6 kg)  Height: 5\' 6"  (1.676 m)   Body mass index is 22.27 kg/m.  General: The patient appears their stated age, thin male.   HEENT:  No gross abnormalities, missing several teeth. Pupils are equal. Pulmonary: Respirations are non-labored, adequate air movement in all fields, CTAB. Abdomen: Soft and non-tender, normal pitched bowel sounds. Musculoskeletal: There are no major deformities. No signs  of ischemia in his lower extremities. Neurologic: No focal weakness or paresthesias are detected. Skin: There are no ulcers or rashes. Psychiatric: The patient has normal affect. Cardiovascular: There is a regular rate and rhythm without significant murmur appreciated.   Vascular: Vessel Right Left  Radial 2+Palpable 2+Palpable  Carotid  without bruit  without bruit  Aorta Not palpable N/A  Femoral 2+Palpable 2+Palpable  Popliteal Not palpable Not palpable  PT Not Palpable Not Palpable  DP 2+Palpable Not Palpable      Non-Invasive Vascular Imaging ABI (Date: 08/08/2016)  R: 1.05 (0.91, 08-09-15), DP: bi, PT: bi, TBI: 0.75 (0.72 previous)  L: 0.68 (0.76), DP: mono, PT: mono, TBI: 0.45 (0.29 previous)   Medical Decision Making  CLOYDE Ford is a 61 y.o. male who presents with mild/moderate bilateral intermittent calf claudication, which is not life limiting for him.  There are no signs of ischemia in his feet/legs. Fortunately he does not have DM but unfortunately he continues to smoke, but seems receptive to quitting.  The patient was counseled re smoking cessation and given several free resources re smoking cessation.    DATA ABI's have improved slightly in his right leg (evidence of no arterial occlusive disease with biphasic waveforms), and declined slightly in his left leg (evidence of moderate arterial occlusive disease with monophasic waveforms).   Based on the patient's vascular studies and examination, I have offered the patient: ABI's and see me in a year. I advised him to notify our office if his claudication worsens or he develops non healing wounds in his LE's.    I discussed in depth with the patient the nature of atherosclerosis, and emphasized the importance of maximal medical management including strict control of blood pressure, blood glucose, and lipid levels, antiplatelet agents, obtaining regular exercise, and cessation of smoking.         I discussed  with the patient the natural history of intermittent claudication: 75% of patients have stable or improved symptoms in a year and only 2% require amputation. Eventually 20% may require intervention in a year.  The patient is aware that without maximal medical management the underlying atherosclerotic disease process will progress, limiting the benefit of any interventions.  The patient is currently on a statin: atorvastatin 80 mg daily.   The patient is currently on an anti-platelet: ASA 81 mg PO daily.  Thank you for allowing [place] to participate in this patient's care.  NICKEL, Sharmon Leyden, RN, MSN, FNP-C [facility] Office: [phone]  08/08/2016, 12:58 PM  Clinic MD: Oneida Alar

## 2016-08-13 NOTE — Addendum Note (Signed)
Addended by: Lianne Cure A on: 08/13/2016 03:36 PM   Modules accepted: Orders

## 2016-09-12 ENCOUNTER — Other Ambulatory Visit: Payer: Self-pay | Admitting: Pharmacist Clinician (PhC)/ Clinical Pharmacy Specialist

## 2016-09-12 MED ORDER — ENSURE PO LIQD: 237.0000 mL | Freq: Two times a day (BID) | ORAL | 11 refills | Status: DC

## 2016-11-07 ENCOUNTER — Other Ambulatory Visit: Payer: Medicare HMO

## 2016-11-07 ENCOUNTER — Other Ambulatory Visit (HOSPITAL_COMMUNITY)
Admission: RE | Admit: 2016-11-07 | Discharge: 2016-11-07 | Disposition: A | Discharge status: Home / Self Care | Payer: Medicare HMO | Source: Ambulatory Visit | Attending: Internal Medicine | Admitting: Internal Medicine

## 2016-11-07 DIAGNOSIS — Z113 Encounter for screening for infections with a predominantly sexual mode of transmission: Secondary | ICD-10-CM | POA: Diagnosis present

## 2016-11-07 DIAGNOSIS — Z79899 Other long term (current) drug therapy: Secondary | ICD-10-CM

## 2016-11-07 DIAGNOSIS — B20 Human immunodeficiency virus [HIV] disease: Secondary | ICD-10-CM

## 2016-11-07 LAB — LIPID PANEL
CHOL/HDL RATIO: 9.1 ratio — AB (ref <5.0)
CHOLESTEROL: 218 mg/dL — AB (ref <200)
HDL: 24 mg/dL — ABNORMAL LOW (ref >40)
LDL Cholesterol: 154 mg/dL — ABNORMAL HIGH (ref <100)
Triglycerides: 202 mg/dL — ABNORMAL HIGH (ref <150)
VLDL: 40 mg/dL — ABNORMAL HIGH (ref <30)

## 2016-11-08 LAB — T-HELPER CELL (CD4) - (RCID CLINIC ONLY)
CD4 % Helper T Cell: 24 % — ABNORMAL LOW (ref 33–55)
CD4 T Cell Abs: 640 /uL (ref 400–2700)

## 2016-11-08 LAB — URINE CYTOLOGY ANCILLARY ONLY
CHLAMYDIA, DNA PROBE: NEGATIVE
Neisseria Gonorrhea: NEGATIVE

## 2016-11-11 LAB — HIV-1 RNA QUANT-NO REFLEX-BLD
HIV 1 RNA QUANT: 56 {copies}/mL — AB
HIV-1 RNA QUANT, LOG: 1.75 {Log_copies}/mL — AB

## 2016-11-21 ENCOUNTER — Ambulatory Visit (INDEPENDENT_AMBULATORY_CARE_PROVIDER_SITE_OTHER): Payer: Medicare HMO | Admitting: Internal Medicine

## 2016-11-21 ENCOUNTER — Encounter: Payer: Self-pay | Admitting: Internal Medicine

## 2016-11-21 VITALS — BP 129/79 | HR 57 | Temp 97.9°F | Wt 147.0 lb

## 2016-11-21 DIAGNOSIS — E785 Hyperlipidemia, unspecified: Secondary | ICD-10-CM | POA: Diagnosis not present

## 2016-11-21 DIAGNOSIS — Z113 Encounter for screening for infections with a predominantly sexual mode of transmission: Secondary | ICD-10-CM | POA: Diagnosis not present

## 2016-11-21 DIAGNOSIS — B20 Human immunodeficiency virus [HIV] disease: Secondary | ICD-10-CM

## 2016-11-21 NOTE — Assessment & Plan Note (Signed)
Doing well.  Some detectable virus likely a blip.  No issues. rtc 6 months

## 2016-11-21 NOTE — Assessment & Plan Note (Signed)
ldl noted and less than 160 on 80 mg atorvastatin

## 2016-11-21 NOTE — Progress Notes (Signed)
   Subjective:    Patient ID: Eric Ford, male    DOB: 19-Dec-1955, 61 y.o.   MRN: [id number]  HPI Here for follow up of HIV  Taking Tivicay and Descovy and denies any missed doses.  No new issues.  No weight loss.  No associated n/v/d.    Review of Systems  Constitutional: Negative for fatigue.  Gastrointestinal: Negative for diarrhea.  Skin: Negative for rash.       Objective:   Physical Exam  Constitutional: He appears well-developed and well-nourished.  Eyes: No scleral icterus.  Cardiovascular: Normal rate, regular rhythm and normal heart sounds.   Skin: No rash noted.          Assessment & Plan:

## 2017-01-13 ENCOUNTER — Encounter: Payer: Self-pay | Admitting: *Deleted

## 2017-01-21 ENCOUNTER — Other Ambulatory Visit: Payer: Self-pay | Admitting: Internal Medicine

## 2017-01-22 ENCOUNTER — Other Ambulatory Visit: Payer: Self-pay | Admitting: Internal Medicine

## 2017-01-22 MED ORDER — EMTRICITABINE-TENOFOVIR AF 200-25 MG PO TABS: 1.0000 | ORAL_TABLET | Freq: Every day | ORAL | 5 refills | Status: DC

## 2017-05-08 ENCOUNTER — Other Ambulatory Visit: Payer: Self-pay | Admitting: *Deleted

## 2017-05-08 MED ORDER — ENSURE PO LIQD: 237.0000 mL | Freq: Two times a day (BID) | ORAL | 11 refills | Status: DC

## 2017-07-22 ENCOUNTER — Other Ambulatory Visit: Payer: Self-pay | Admitting: Internal Medicine

## 2017-07-22 DIAGNOSIS — B20 Human immunodeficiency virus [HIV] disease: Secondary | ICD-10-CM

## 2017-07-22 MED ORDER — EMTRICITABINE-TENOFOVIR AF 200-25 MG PO TABS: 1.0000 | ORAL_TABLET | Freq: Every day | ORAL | 5 refills | Status: DC

## 2017-08-14 ENCOUNTER — Ambulatory Visit (INDEPENDENT_AMBULATORY_CARE_PROVIDER_SITE_OTHER): Payer: Medicare Other | Admitting: Family

## 2017-08-14 ENCOUNTER — Encounter: Payer: Self-pay | Admitting: Family

## 2017-08-14 ENCOUNTER — Ambulatory Visit (HOSPITAL_COMMUNITY)
Admission: RE | Admit: 2017-08-14 | Discharge: 2017-08-14 | Disposition: A | Discharge status: Home / Self Care | Payer: Medicare Other | Source: Ambulatory Visit | Attending: Family | Admitting: Family

## 2017-08-14 VITALS — BP 132/78 | HR 66 | Resp 20 | Ht 66.0 in | Wt 156.0 lb

## 2017-08-14 DIAGNOSIS — I70213 Atherosclerosis of native arteries of extremities with intermittent claudication, bilateral legs: Secondary | ICD-10-CM | POA: Diagnosis present

## 2017-08-14 DIAGNOSIS — F17200 Nicotine dependence, unspecified, uncomplicated: Secondary | ICD-10-CM | POA: Diagnosis not present

## 2017-08-14 NOTE — Patient Instructions (Signed)
Steps to Quit Smoking Smoking tobacco can be bad for your health. It can also affect almost every organ in your body. Smoking puts you and people around you at risk for many serious long-lasting (chronic) diseases. Quitting smoking is hard, but it is one of the best things that you can do for your health. It is never too late to quit. What are the benefits of quitting smoking? When you quit smoking, you lower your risk for getting serious diseases and conditions. They can include:  Lung cancer or lung disease.  Heart disease.  Stroke.  Heart attack.  Not being able to have children (infertility).  Weak bones (osteoporosis) and broken bones (fractures).  If you have coughing, wheezing, and shortness of breath, those symptoms may get better when you quit. You may also get sick less often. If you are pregnant, quitting smoking can help to lower your chances of having a baby of low birth weight. What can I do to help me quit smoking? Talk with your doctor about what can help you quit smoking. Some things you can do (strategies) include:  Quitting smoking totally, instead of slowly cutting back how much you smoke over a period of time.  Going to in-person counseling. You are more likely to quit if you go to many counseling sessions.  Using resources and support systems, such as: ? Online chats with a counselor. ? Phone quitlines. ? Printed self-help materials. ? Support groups or group counseling. ? Text messaging programs. ? Mobile phone apps or applications.  Taking medicines. Some of these medicines may have nicotine in them. If you are pregnant or breastfeeding, do not take any medicines to quit smoking unless your doctor says it is okay. Talk with your doctor about counseling or other things that can help you.  Talk with your doctor about using more than one strategy at the same time, such as taking medicines while you are also going to in-person counseling. This can help make  quitting easier. What things can I do to make it easier to quit? Quitting smoking might feel very hard at first, but there is a lot that you can do to make it easier. Take these steps:  Talk to your family and friends. Ask them to support and encourage you.  Call phone quitlines, reach out to support groups, or work with a counselor.  Ask people who smoke to not smoke around you.  Avoid places that make you want (trigger) to smoke, such as: ? Bars. ? Parties. ? Smoke-break areas at work.  Spend time with people who do not smoke.  Lower the stress in your life. Stress can make you want to smoke. Try these things to help your stress: ? Getting regular exercise. ? Deep-breathing exercises. ? Yoga. ? Meditating. ? Doing a body scan. To do this, close your eyes, focus on one area of your body at a time from head to toe, and notice which parts of your body are tense. Try to relax the muscles in those areas.  Download or buy apps on your mobile phone or tablet that can help you stick to your quit plan. There are many free apps, such as QuitGuide from the CDC (Centers for Disease Control and Prevention). You can find more support from smokefree.gov and other websites.  This information is not intended to replace advice given to you by your health care provider. Make sure you discuss any questions you have with your health care provider. Document Released: 05/18/2009 Document   Revised: 03/19/2016 Document Reviewed: 12/06/2014 Elsevier Interactive Patient Education  2018 [employer].     Intermittent Claudication Intermittent claudication is pain in your leg that occurs when you walk or exercise and goes away when you rest. The pain can occur in one or both legs. What are the causes? Intermittent claudication is caused by the buildup of plaque within the major arteries in the body (atherosclerosis). The plaque, which makes arteries stiff and narrow, prevents enough blood from reaching your  leg muscles. The pain occurs when you walk or exercise because your muscles need more blood when you are moving and exercising. What increases the risk? Risk factors include:  A family history of atherosclerosis.  A personal history of stroke or heart disease.  Older age.  Being inactive or overweight.  Smoking cigarettes.  Having another health condition such as: ? Diabetes. ? High blood pressure. ? High cholesterol.  What are the signs or symptoms? Your hip or leg may:  Ache.  Cramp.  Feel tight.  Feel weak.  Feel heavy.  Over time, you may feel pain in your calf, thigh, or hip. How is this diagnosed? Your health care provider may diagnose intermittent claudication based on your symptoms and medical history. Your health care provider may also do tests to learn more about your condition. These may include:  Blood tests.  An ultrasound.  Imaging tests such as angiography, magnetic resonance angiography (MRA), and computed tomography angiography (CTA).  How is this treated? You may be treated for problems such as:  High blood pressure.  High cholesterol.  Diabetes.  Other treatments may include:  Lifestyle changes such as: ? Starting an exercise program. ? Losing weight. ? Quitting smoking.  Medicines to help restore blood flow through your legs.  Blood vessel surgery (angioplasty) to restore blood flow if your intermittent claudication is caused by severe peripheral artery disease.  Follow these instructions at home:  Manage any other health conditions you have.  Eat a diet low in saturated fats and calories to maintain a healthy weight.  Quit smoking, if you smoke.  Take medicines only as directed by your health care provider.  If your health care provider recommended an exercise program for you, follow it as directed. Your exercise program may involve: ? Walking three or more times a week. ? Walking until you have certain symptoms of  intermittent claudication. ? Resting until symptoms go away. ? Gradually increasing walking time to about 50 minutes a day. Contact a health care provider if: Your condition is not getting better or is getting worse. Get help right away if:  You have chest pain.  You have difficulty breathing.  You develop arm weakness.  You have trouble speaking.  Your face begins to droop. This information is not intended to replace advice given to you by your health care provider. Make sure you discuss any questions you have with your health care provider. Document Released: 05/24/2004 Document Revised: 12/28/2015 Document Reviewed: 10/28/2013 Elsevier Interactive Patient Education  2017 [employer].

## 2017-08-14 NOTE — Progress Notes (Signed)
CC: Follow up Intermittent Claudication  History of Present Illness  Eric Ford is a 62 y.o. (01-Mar-1956) male patient of Dr. Oneida Alar whopresents for follow up of bilateral calf intermittent claudication.   Today, he says he is able to walk a block without stopping. He usually walks about 4 blocks every morning.  After walking about 1 block his right calf hurts, relieved by rest. His left calf claudicates much later, but his right calf will cause him to rest usually before the left calf claudicates.   On ROS today: he denies any rest pain and non healing wounds.   The pt was last evaluated by Dr. Oneida Alar on 06/19/14. At that time his claudication wais not lifestyle limiting and he was satisfied with his walking distance. His ABIs were reasonable and he was not at risk of limb loss. Dr. Oneida Alar advised the pt to continue a walking program of 30 minutes daily and continue his aspirin. Dr. Oneida Alar discussed with him stopping smoking marijuana. He was to follow-up in 6 months time with repeat ABIs.  He states he feels light headed after walking or standing for about 30 minutes and he reports a syncopal episode about November 2016 in which he fell and injured an eye orbit; he had a hx of hypotension. Since then the HCTZ was stopped.  He states he drinks at least 8 cups of fluid per day.  Pt Diabetic: No Pt smoker: smoker  (1/3 ppd x started in his teens) He states he has patches, has not used them yet.   Pt meds include: Statin :Yes Betablocker: No ASA: Yes Other anticoagulants/antiplatelets: no   Past Medical History:  Diagnosis Date  . HIV infection ([place])   . Substance abuse [facility])     Social History Social History   Tobacco Use  . Smoking status: Current Every Day Smoker    Packs/day: 0.25    Types: Cigars    Last attempt to quit: 01/11/2009    Years since quitting: 8.5  . Smokeless tobacco: Never Used  . Tobacco comment: smokes 3-4 cigars daily  Substance Use  Topics  . Alcohol use: Yes    Alcohol/week: 0.0 oz    Comment: very seldom  . Drug use: Yes    Types: Marijuana    Comment: rarely    Family History Family History  Problem Relation Age of Onset  . Cancer Mother   . Heart disease Mother   . Hypertension Mother   . Heart attack Mother   . Hypertension Father   . Heart attack Father   . Cancer Sister   . Diabetes Sister   . Hyperlipidemia Sister   . Hypertension Sister   . Peripheral vascular disease Sister   . AAA (abdominal aortic aneurysm) Sister   . Diabetes Brother   . Hypertension Brother   . Hypertension Son     Surgical History Past Surgical History:  Procedure Laterality Date  . COLONOSCOPY    . COLONOSCOPY     done years ago at [facility]  . POLYPECTOMY    . TRUNK WOUND REPAIR / CLOSURE      No Known Allergies  Current Outpatient Medications  Medication Sig Dispense Refill  . aspirin EC 81 MG tablet Take 1 tablet (81 mg total) by mouth daily. 100 tablet 3  . atorvastatin (LIPITOR) 80 MG tablet Take 1 tablet (80 mg total) by mouth at bedtime. 30 tablet 12  . emtricitabine-tenofovir AF (DESCOVY) 200-25 MG tablet Take 1  tablet by mouth daily. 30 tablet 5  . ENSURE (ENSURE) Take 237 mLs by mouth 2 (two) times daily between meals. 237 mL 11  . nicotine ([employer] NICOTINE TRANSDERMAL SYS) 14 mg/24hr patch Place 1 patch (14 mg total) onto the skin daily. 28 patch 0  . TIVICAY 50 MG tablet TAKE 1 TABLET BY MOUTH DAILY(DISCONTINUE GENVOYA) 30 tablet 5  . ferrous sulfate 325 (65 FE) MG tablet Take 1 tablet (325 mg total) by mouth daily. (Patient not taking: Reported on 05/23/2016) 30 tablet 6   No current facility-administered medications for this visit.     REVIEW OF SYSTEMS: see HPI for pertinent positives and negatives    Physical Examination  Vitals:   08/14/17 1143  BP: 132/78  Pulse: 66  Resp: 20  SpO2: 96%  Weight: 156 lb (70.8 kg)  Height: 5\' 6"  (1.676 m)   Body mass index is 25.18  kg/m.  General: The patient appears their stated age, male.  HEENT: No gross abnormalities, missing several teeth. Pupils are equal. Pulmonary: Respirations are non-labored, adequate air movement in all fields, CTAB. Abdomen: Soft and non-tender, normal pitched bowel sounds. Musculoskeletal:There are no major deformities. No signs of ischemia in his lower extremities. Neurologic:No focal weakness or paresthesias are detected. Skin:There are no ulcers or rashes. Psychiatric:The patient has normal affect. Cardiovascular:There is a regular rate and rhythm without significant murmur appreciated.   Vascular: Vessel Right Left  Radial 2+Palpable 2+Palpable  Carotid without bruit without bruit  Aorta Not palpable N/A  Femoral 2+Palpable 2+Palpable  Popliteal Not palpable Not palpable  PT Not Palpable Not Palpable  DP 1+Palpable Not Palpable     DATA  ABI (Date: 08/14/2017):  R: 0.85 (was 1.05 on 08-08-16), DP: bi, PT: tri, TBI: 0.60 (was 0.75)  L: 0.60 (was 0.68), DP: mono, PT: mono, TBI: 0.35 (was 0.45) Decline in bilateral ABI with mild disease on the right (tri and biphasic waveforms), and moderate disease on the left with monophasic waveforms.   Medical Decision Making  Eric Ford is a 62 y.o. male who presents with mild/moderate bilateral intermittent calf claudication, which is not life limiting for him.  There are no signs of ischemia in his feet/legs.  Fortunately he does not have DM but unfortunately he continues to smoke, but seems receptive to quitting.  The patient was counseled re smoking cessation and given several free resources re smoking cessation.  Graduated walking program discussed and how to achieve.    Based on the patient's vascular studies and examination, I have offered the patient: ABI's and see me in a year. I advised him to notify our office if his claudication worsens or he develops non healing wounds in his LE's.   I discussed in  depth with the patient the nature of atherosclerosis, and emphasized the importance of maximal medical management including strict control of blood pressure, blood glucose, and lipid levels, antiplatelet agents, obtaining regular exercise, and cessation of smoking.         I discussed with the patient the natural history of intermittent claudication: 75% of patients have stable or improved symptoms in a year an only 2% require amputation. Eventually 20% may require intervention in a year.  The patient is aware that without maximal medical management the underlying atherosclerotic disease process will progress, limiting the benefit of any interventions. The patient is currently on a statin. The patient is currently on an anti-platelet.  Thank you for allowing [place] to participate in this patient's care.  Clemon Chambers, RN, MSN, FNP-C [facility] Office: [phone]  08/14/2017, 12:18 PM  Clinic MD: Oneida Alar

## 2017-10-22 ENCOUNTER — Telehealth: Payer: Self-pay | Admitting: *Deleted

## 2017-10-22 NOTE — Telephone Encounter (Signed)
Received a call from [place], [facility] intern, asking for refill of patient's ensure. Per last weight, patient's bmi now over 25. Patient also overdue for visit. Fuller Song will ask patient to call for follow up, where his ensure and health issues can be addressed. Landis Gandy, RN

## 2017-11-19 ENCOUNTER — Other Ambulatory Visit (HOSPITAL_COMMUNITY)
Admission: RE | Admit: 2017-11-19 | Discharge: 2017-11-19 | Disposition: A | Discharge status: Home / Self Care | Payer: Medicare Other | Source: Ambulatory Visit | Attending: Internal Medicine | Admitting: Internal Medicine

## 2017-11-19 ENCOUNTER — Other Ambulatory Visit: Payer: Medicare Other

## 2017-11-19 DIAGNOSIS — B20 Human immunodeficiency virus [HIV] disease: Secondary | ICD-10-CM | POA: Diagnosis not present

## 2017-11-19 DIAGNOSIS — Z113 Encounter for screening for infections with a predominantly sexual mode of transmission: Secondary | ICD-10-CM | POA: Diagnosis present

## 2017-11-20 LAB — COMPLETE METABOLIC PANEL WITH GFR
AG Ratio: 1 (calc) (ref 1.0–2.5)
ALKALINE PHOSPHATASE (APISO): 96 U/L (ref 40–115)
ALT: 25 U/L (ref 9–46)
AST: 27 U/L (ref 10–35)
Albumin: 4 g/dL (ref 3.6–5.1)
BUN/Creatinine Ratio: 13 (calc) (ref 6–22)
BUN: 17 mg/dL (ref 7–25)
CO2: 24 mmol/L (ref 20–32)
Calcium: 9.3 mg/dL (ref 8.6–10.3)
Chloride: 104 mmol/L (ref 98–110)
Creat: 1.29 mg/dL — ABNORMAL HIGH (ref 0.70–1.25)
GFR, Est African American: 68 mL/min/{1.73_m2} (ref >=60)
GFR, Est Non African American: 59 mL/min/{1.73_m2} — ABNORMAL LOW (ref >=60)
GLUCOSE: 160 mg/dL — AB (ref 65–99)
Globulin: 3.9 g/dL (calc) — ABNORMAL HIGH (ref 1.9–3.7)
POTASSIUM: 4.3 mmol/L (ref 3.5–5.3)
Sodium: 135 mmol/L (ref 135–146)
Total Bilirubin: 0.3 mg/dL (ref 0.2–1.2)
Total Protein: 7.9 g/dL (ref 6.1–8.1)

## 2017-11-20 LAB — CBC WITH DIFFERENTIAL/PLATELET
BASOS ABS: 62 {cells}/uL (ref 0–200)
BASOS PCT: 1 %
EOS ABS: 99 {cells}/uL (ref 15–500)
Eosinophils Relative: 1.6 %
HCT: 45.5 % (ref 38.5–50.0)
HEMOGLOBIN: 15.1 g/dL (ref 13.2–17.1)
Lymphs Abs: 2096 cells/uL (ref 850–3900)
MCH: 26.8 pg — AB (ref 27.0–33.0)
MCHC: 33.2 g/dL (ref 32.0–36.0)
MCV: 80.7 fL (ref 80.0–100.0)
MPV: 10.1 fL (ref 7.5–12.5)
Monocytes Relative: 6.1 %
NEUTROS ABS: 3565 {cells}/uL (ref 1500–7800)
Neutrophils Relative %: 57.5 %
Platelets: 261 10*3/uL (ref 140–400)
RBC: 5.64 10*6/uL (ref 4.20–5.80)
RDW: 13.7 % (ref 11.0–15.0)
Total Lymphocyte: 33.8 %
WBC: 6.2 10*3/uL (ref 3.8–10.8)
WBCMIX: 378 {cells}/uL (ref 200–950)

## 2017-11-20 LAB — RPR: RPR: NONREACTIVE

## 2017-11-20 LAB — URINE CYTOLOGY ANCILLARY ONLY
CHLAMYDIA, DNA PROBE: NEGATIVE
Neisseria Gonorrhea: NEGATIVE

## 2017-11-20 LAB — T-HELPER CELL (CD4) - (RCID CLINIC ONLY)
CD4 T CELL ABS: 530 /uL (ref 400–2700)
CD4 T CELL HELPER: 24 % — AB (ref 33–55)

## 2017-11-21 LAB — HIV-1 RNA QUANT-NO REFLEX-BLD
HIV 1 RNA Quant: 46 copies/mL — ABNORMAL HIGH
HIV-1 RNA Quant, Log: 1.66 Log copies/mL — ABNORMAL HIGH

## 2017-12-03 ENCOUNTER — Ambulatory Visit (INDEPENDENT_AMBULATORY_CARE_PROVIDER_SITE_OTHER): Payer: Medicare Other | Admitting: Internal Medicine

## 2017-12-03 ENCOUNTER — Encounter: Payer: Self-pay | Admitting: Internal Medicine

## 2017-12-03 VITALS — BP 123/80 | HR 88 | Temp 97.9°F | Resp 18 | Ht 66.0 in | Wt 150.8 lb

## 2017-12-03 DIAGNOSIS — B20 Human immunodeficiency virus [HIV] disease: Secondary | ICD-10-CM

## 2017-12-03 DIAGNOSIS — R636 Underweight: Secondary | ICD-10-CM | POA: Diagnosis not present

## 2017-12-03 DIAGNOSIS — Z113 Encounter for screening for infections with a predominantly sexual mode of transmission: Secondary | ICD-10-CM

## 2017-12-03 NOTE — Assessment & Plan Note (Signed)
Good weight gain and normal BMI.  Ensure not indicated.

## 2017-12-03 NOTE — Assessment & Plan Note (Signed)
Screened negative 

## 2017-12-03 NOTE — Assessment & Plan Note (Signed)
Doing well on tivicay and descovy.  Not interested in changing.   Will do pneumovax next visit.

## 2017-12-03 NOTE — Progress Notes (Signed)
   Subjective:    Patient ID: Eric Ford, male    DOB: Jun 25, 1956, 62 y.o.   MRN: [id number]  HPI Here for follow up of HIV  Taking Tivicay and Descovy and denies any missed doses.  No new issues.  No weight loss and actually good weight gain with a BMI of 24.  No associated n/v/d. Asking for Ensure. Has not been back to his PCP in a year.    Review of Systems  Constitutional: Negative for fatigue.  Gastrointestinal: Negative for diarrhea.  Skin: Negative for rash.       Objective:   Physical Exam  Constitutional: He appears well-developed and well-nourished.  HENT:  Mouth/Throat: No oropharyngeal exudate.  Eyes: No scleral icterus.  Cardiovascular: Normal rate, regular rhythm and normal heart sounds.  Pulmonary/Chest: Effort normal and breath sounds normal. No respiratory distress.  Skin: No rash noted.   SH: some cigars       Assessment & Plan:

## 2018-01-16 ENCOUNTER — Other Ambulatory Visit: Payer: Self-pay | Admitting: Internal Medicine

## 2018-01-16 DIAGNOSIS — B20 Human immunodeficiency virus [HIV] disease: Secondary | ICD-10-CM

## 2018-02-13 ENCOUNTER — Ambulatory Visit (INDEPENDENT_AMBULATORY_CARE_PROVIDER_SITE_OTHER): Payer: Medicare Other | Admitting: Internal Medicine

## 2018-02-13 ENCOUNTER — Other Ambulatory Visit: Payer: Self-pay

## 2018-02-13 ENCOUNTER — Encounter: Payer: Self-pay | Admitting: Internal Medicine

## 2018-02-13 ENCOUNTER — Encounter (INDEPENDENT_AMBULATORY_CARE_PROVIDER_SITE_OTHER): Payer: Self-pay

## 2018-02-13 DIAGNOSIS — B1920 Unspecified viral hepatitis C without hepatic coma: Secondary | ICD-10-CM | POA: Diagnosis not present

## 2018-02-13 DIAGNOSIS — Z720 Tobacco use: Secondary | ICD-10-CM

## 2018-02-13 DIAGNOSIS — I10 Essential (primary) hypertension: Secondary | ICD-10-CM

## 2018-02-13 DIAGNOSIS — E785 Hyperlipidemia, unspecified: Secondary | ICD-10-CM

## 2018-02-13 DIAGNOSIS — Z7982 Long term (current) use of aspirin: Secondary | ICD-10-CM

## 2018-02-13 DIAGNOSIS — I739 Peripheral vascular disease, unspecified: Secondary | ICD-10-CM

## 2018-02-13 DIAGNOSIS — Z79899 Other long term (current) drug therapy: Secondary | ICD-10-CM

## 2018-02-13 DIAGNOSIS — D126 Benign neoplasm of colon, unspecified: Secondary | ICD-10-CM | POA: Diagnosis not present

## 2018-02-13 DIAGNOSIS — B20 Human immunodeficiency virus [HIV] disease: Secondary | ICD-10-CM

## 2018-02-13 DIAGNOSIS — Z122 Encounter for screening for malignant neoplasm of respiratory organs: Secondary | ICD-10-CM

## 2018-02-13 MED ORDER — VARENICLINE TARTRATE 0.5 MG X 11 & 1 MG X 42 PO MISC: ORAL | 0 refills | Status: DC

## 2018-02-13 MED ORDER — ATORVASTATIN CALCIUM 40 MG PO TABS: 40.0000 mg | ORAL_TABLET | Freq: Every day | ORAL | 11 refills | Status: DC

## 2018-02-13 NOTE — Assessment & Plan Note (Signed)
Referred patient to [facility] for colonoscopy.   Tubular adenoma discovered on colonoscopy in 2012

## 2018-02-13 NOTE — Assessment & Plan Note (Signed)
Start Chantix today 

## 2018-02-13 NOTE — Assessment & Plan Note (Signed)
Only on aspirin 81 mg.    Will START atorvastatin 40 mg    ASCVD score 14.8%

## 2018-02-13 NOTE — Progress Notes (Signed)
   CC: Health maintenance  HPI:  [name] is a 62 y.o. African-American male with a past medical history of HIV, peripheral arterial disease, hypertension, hepatitis C, colonic tubular adenoma, tobacco use disorder presented today for health maintenance exam.  He reports that he is doing well today his only complaint is claudication of bilateral lower extremities that improves with rest.  He denies any weight loss and has actually gained 30 pounds in the past year.  He also denies fever, chills, night sweats, chest pain, palpitation, shortness of breath, anginal symptoms, urinary incontinence, constipation, abdominal pain.   Patient has regular follow-ups with infectious disease and vascular surgery.  He does not report of any recent hospitalizations.   He reports that he smokes about 7 to 8 cigars/day has been smoking for the last 4 years but however would like to try Chantix.  He sexually active with women and uses condom.  Current medications are aspirin 81 mg, Tivicay and Descovy.   Past Medical History:  Diagnosis Date  . HIV infection ([place])   . Substance abuse ([place])    Review of Systems: Negative except for HPI  Physical Exam:  Vitals:   02/13/18 1401  BP: 109/74  Pulse: (!) 56  Temp: 98.1 F (36.7 C)  TempSrc: Oral  SpO2: 99%  Weight: 151 lb 14.4 oz (68.9 kg)   Physical Exam  Constitutional: He is oriented to person, place, and time and well-developed, well-nourished, and in no distress. No distress.  HENT:  Head: Normocephalic and atraumatic.  Mouth/Throat: No oropharyngeal exudate.  Eyes: Right eye exhibits no discharge. No scleral icterus.  Neck: Normal range of motion. Neck supple. No thyromegaly present.  Cardiovascular: Normal rate, regular rhythm and normal heart sounds. Exam reveals no gallop and no friction rub.  No murmur heard. Pulmonary/Chest: Breath sounds normal. No respiratory distress. He has no wheezes. He has no rales.  Abdominal: Soft.  Bowel sounds are normal. He exhibits no distension. There is no tenderness.  Musculoskeletal: Normal range of motion.  Lymphadenopathy:    He has no cervical adenopathy.  Neurological: He is oriented to person, place, and time. Gait normal.  Psychiatric: Affect normal.  Extremities: Decreased dorsalis pedis pulse on left lower extremity.  Assessment & Plan:   See Encounters Tab for problem based charting.  Patient discussed with Dr. Dareen Piano.

## 2018-02-13 NOTE — Patient Instructions (Addendum)
Mr. Maulden,  He was seen at the clinic today for health maintenance.  1. I am ordering lab called to check your kidneys 2.  I am getting a CT scan of your lungs to screen for lung cancer because of your history of smoking and your age 62.  I will start you on a cholesterol medication called Lipitor.  You can pick this up at the pharmacy 4.  I will also give you a prescription for Chantix.  You have to set a date that you want to quit smoking and start this medication 1 week before that day.  Dr. Eileen Stanford

## 2018-02-13 NOTE — Assessment & Plan Note (Signed)
Currently normotensive and not taking any medication.  Today BP of 109/74.    Advised patient to quit smoking

## 2018-02-13 NOTE — Addendum Note (Signed)
Addended by: Jean Rosenthal on: 02/13/2018 03:49 PM   Modules accepted: Orders

## 2018-02-13 NOTE — Assessment & Plan Note (Signed)
Doing well 30 pound weight gain in the last year.  CD4 530, viral load 46.  Continue HIV meds

## 2018-02-13 NOTE — Assessment & Plan Note (Signed)
Labs from 11/07/2016 reviewed LDL of 154, collection of 218.  Not currently taking statin  We will start atorvastatin 40 mg today

## 2018-02-16 ENCOUNTER — Encounter: Payer: Self-pay | Admitting: Gastroenterology

## 2018-02-18 ENCOUNTER — Other Ambulatory Visit: Payer: Self-pay | Admitting: Internal Medicine

## 2018-02-18 DIAGNOSIS — B20 Human immunodeficiency virus [HIV] disease: Secondary | ICD-10-CM

## 2018-02-25 ENCOUNTER — Encounter: Payer: Self-pay | Admitting: *Deleted

## 2018-02-25 ENCOUNTER — Ambulatory Visit (HOSPITAL_COMMUNITY)
Admission: RE | Admit: 2018-02-25 | Discharge: 2018-02-25 | Disposition: A | Discharge status: Home / Self Care | Payer: Medicare Other | Source: Ambulatory Visit | Attending: Internal Medicine | Admitting: Internal Medicine

## 2018-02-25 DIAGNOSIS — J984 Other disorders of lung: Secondary | ICD-10-CM | POA: Diagnosis not present

## 2018-02-25 DIAGNOSIS — I700 Atherosclerosis of aorta: Secondary | ICD-10-CM | POA: Diagnosis not present

## 2018-02-25 DIAGNOSIS — N62 Hypertrophy of breast: Secondary | ICD-10-CM | POA: Diagnosis not present

## 2018-02-25 DIAGNOSIS — Z122 Encounter for screening for malignant neoplasm of respiratory organs: Secondary | ICD-10-CM | POA: Diagnosis not present

## 2018-02-25 DIAGNOSIS — Z87891 Personal history of nicotine dependence: Secondary | ICD-10-CM | POA: Insufficient documentation

## 2018-02-25 DIAGNOSIS — J219 Acute bronchiolitis, unspecified: Secondary | ICD-10-CM | POA: Diagnosis not present

## 2018-03-01 ENCOUNTER — Telehealth: Payer: Self-pay | Admitting: Internal Medicine

## 2018-03-02 NOTE — Telephone Encounter (Signed)
I called patient today and discussed his low-dose CT results.  Impression 1. Lung-RADS 2, benign appearance or behavior. Continue annual screening with low-dose chest CT without contrast in 12 months. 2. Evidence respiratory bronchiolitis of smoking related lung disease (respiratory bronchiolitis). 3.  Aortic Atherosclerois (ICD10-170.0) 4. Bilateral gynecomastia.

## 2018-03-05 ENCOUNTER — Telehealth: Payer: Self-pay | Admitting: Internal Medicine

## 2018-03-05 NOTE — Progress Notes (Signed)
Internal Medicine Clinic Attending  I saw and evaluated the patient.  I personally confirmed the key portions of the history and exam documented by Dr. Agyei and I reviewed pertinent patient test results.  The assessment, diagnosis, and plan were formulated together and I agree with the documentation in the resident's note.  

## 2018-03-10 ENCOUNTER — Other Ambulatory Visit: Payer: Self-pay | Admitting: Internal Medicine

## 2018-04-08 ENCOUNTER — Other Ambulatory Visit: Payer: Self-pay

## 2018-04-08 ENCOUNTER — Ambulatory Visit (AMBULATORY_SURGERY_CENTER): Payer: Self-pay | Admitting: *Deleted

## 2018-04-08 VITALS — Ht 66.0 in | Wt 148.6 lb

## 2018-04-08 DIAGNOSIS — Z86010 Personal history of colonic polyps: Secondary | ICD-10-CM

## 2018-04-08 MED ORDER — SUPREP BOWEL PREP KIT 17.5-3.13-1.6 GM/177ML PO SOLN: 1.0000 | Freq: Once | ORAL | 0 refills | Status: AC

## 2018-04-08 NOTE — Progress Notes (Signed)
No egg or soy allergy known to patient  No issues with past sedation with any surgeries  or procedures, no intubation problems  No diet pills per patient No home 02 use per patient  No blood thinners per patient  Pt denies issues with constipation  No A fib or A flutter  EMMI video offered and the patient declined.

## 2018-04-09 ENCOUNTER — Other Ambulatory Visit: Payer: Self-pay | Admitting: Internal Medicine

## 2018-04-09 NOTE — Telephone Encounter (Signed)
Refill not appropriate. Was ordered for zero refills. I will discuss smoking cessation again with him at my next appointment

## 2018-04-22 ENCOUNTER — Ambulatory Visit (AMBULATORY_SURGERY_CENTER): Payer: Medicare Other | Admitting: Gastroenterology

## 2018-04-22 ENCOUNTER — Encounter: Payer: Self-pay | Admitting: Gastroenterology

## 2018-04-22 VITALS — BP 112/63 | HR 68 | Temp 98.0°F | Resp 19 | Ht 66.0 in | Wt 148.0 lb

## 2018-04-22 DIAGNOSIS — Z86010 Personal history of colonic polyps: Secondary | ICD-10-CM | POA: Diagnosis not present

## 2018-04-22 DIAGNOSIS — D124 Benign neoplasm of descending colon: Secondary | ICD-10-CM

## 2018-04-22 DIAGNOSIS — D125 Benign neoplasm of sigmoid colon: Secondary | ICD-10-CM

## 2018-04-22 MED ORDER — SODIUM CHLORIDE 0.9 % IV SOLN: 500.0000 mL | Freq: Once | INTRAVENOUS | Status: DC

## 2018-04-22 NOTE — Progress Notes (Signed)
Pt's states no medical or surgical changes since previsit or office visit. 

## 2018-04-22 NOTE — Progress Notes (Signed)
Called to room to assist during endoscopic procedure.  Patient ID and intended procedure confirmed with present staff. Received instructions for my participation in the procedure from the performing physician.  

## 2018-04-22 NOTE — Patient Instructions (Signed)
YOU HAD AN ENDOSCOPIC PROCEDURE TODAY AT [facility]:   Refer to the procedure report that was given to you for any specific questions about what was found during the examination.  If the procedure report does not answer your questions, please call your gastroenterologist to clarify.  If you requested that your care partner not be given the details of your procedure findings, then the procedure report has been included in a sealed envelope for you to review at your convenience later.  YOU SHOULD EXPECT: Some feelings of bloating in the abdomen. Passage of more gas than usual.  Walking can help get rid of the air that was put into your GI tract during the procedure and reduce the bloating. If you had a lower endoscopy (such as a colonoscopy or flexible sigmoidoscopy) you may notice spotting of blood in your stool or on the toilet paper. If you underwent a bowel prep for your procedure, you may not have a normal bowel movement for a few days.  Please Note:  You might notice some irritation and congestion in your nose or some drainage.  This is from the oxygen used during your procedure.  There is no need for concern and it should clear up in a day or so.  SYMPTOMS TO REPORT IMMEDIATELY:   Following lower endoscopy (colonoscopy or flexible sigmoidoscopy):  Excessive amounts of blood in the stool  Significant tenderness or worsening of abdominal pains  Swelling of the abdomen that is new, acute  Fever of 100F or higher  For urgent or emergent issues, a gastroenterologist can be reached at any hour by calling [phone].   DIET:  We do recommend a small meal at first, but then you may proceed to your regular diet.  Drink plenty of fluids but you should avoid alcoholic beverages for 24 hours.  ACTIVITY:  You should plan to take it easy for the rest of today and you should NOT DRIVE or use heavy machinery until tomorrow (because of the sedation medicines used during the test).     FOLLOW UP: Our staff will call the number listed on your records the next business day following your procedure to check on you and address any questions or concerns that you may have regarding the information given to you following your procedure. If we do not reach you, we will leave a message.  However, if you are feeling well and you are not experiencing any problems, there is no need to return our call.  We will assume that you have returned to your regular daily activities without incident.  If any biopsies were taken you will be contacted by phone or by letter within the next 1-3 weeks.  Please call us at [phone] if you have not heard about the biopsies in 3 weeks.   Await for biopsy results, will likely need a double prep and repeat Colonoscopy in 1-2 months due to inadequate prep today Polyps (handout given)   SIGNATURES/CONFIDENTIALITY: You and/or your care partner have signed paperwork which will be entered into your electronic medical record.  These signatures attest to the fact that that the information above on your After Visit Summary has been reviewed and is understood.  Full responsibility of the confidentiality of this discharge information lies with you and/or your care-partner.

## 2018-04-22 NOTE — Op Note (Signed)
[name] Procedure Date: 04/22/2018 10:43 AM MRN: [id number] Endoscopist: Milus Banister , MD Age: 62 Referring MD:  Date of Birth: February 04, 1956 Gender: Male Account #: [id number] Procedure:                Colonoscopy Indications:              High risk colon cancer surveillance: Personal                            history of colonic polyps; colonoscopy 2012 three                            subCM adenomas removed Medicines:                Monitored Anesthesia Care Procedure:                Pre-Anesthesia Assessment:                           - Prior to the procedure, a History and Physical                            was performed, and patient medications and                            allergies were reviewed. The patient's tolerance of                            previous anesthesia was also reviewed. The risks                            and benefits of the procedure and the sedation                            options and risks were discussed with the patient.                            All questions were answered, and informed consent                            was obtained. Prior Anticoagulants: The patient has                            taken no previous anticoagulant or antiplatelet                            agents. ASA Grade Assessment: II - A patient with                            mild systemic disease. After reviewing the risks                            and benefits, the patient was deemed in  satisfactory condition to undergo the procedure.                           After obtaining informed consent, the colonoscope                            was passed under direct vision. Throughout the                            procedure, the patient's blood pressure, pulse, and                            oxygen saturations were monitored continuously. The                            Colonoscope was introduced through the  anus and                            advanced to the the cecum, identified by                            appendiceal orifice and ileocecal valve. The                            colonoscopy was performed without difficulty. The                            patient tolerated the procedure well. The quality                            of the bowel preparation was inadequate (solid and                            liquid stool especially in the right colon). Scope In: 10:48:39 AM Scope Out: 10:57:46 AM Scope Withdrawal Time: 0 hours 6 minutes 27 seconds  Total Procedure Duration: 0 hours 9 minutes 7 seconds  Findings:                 Four sessile polyps were found in the sigmoid colon                            and descending colon. The polyps were 3 to 4 mm in                            size. These polyps were removed with a cold snare.                            Resection was complete, but the polyp tissue was                            only partially retrieved.                           Inadequate prep, especially in  the right colon.                           The exam was otherwise without abnormality on                            direct and retroflexion views. Complications:            No immediate complications. Estimated blood loss:                            None. Estimated Blood Loss:     Estimated blood loss: none. Impression:               - Preparation of the colon was inadequate (solid                            and liquid stool in the right colon).                           - Four 3 to 4 mm polyps in the sigmoid colon and in                            the descending colon, removed with a cold snare.                            Complete resection. Partial retrieval (3/4 polyps                            retrieved).                           - The examination was otherwise normal on direct                            and retroflexion views. Recommendation:           - Patient has a  contact number available for                            emergencies. The signs and symptoms of potential                            delayed complications were discussed with the                            patient. Return to normal activities tomorrow.                            Written discharge instructions were provided to the                            patient.                           - Resume previous diet.                           -  Continue present medications.                           - await final pathology results but likely you will                            need repeat colonoscopy (with double prep) in the                            next 1-2 months give the inadequate prep today.                            Small but significant lesions could have been                            missed, especially in the right colon. Milus Banister, MD 04/22/2018 11:04:56 AM This report has been signed electronically.

## 2018-04-22 NOTE — Progress Notes (Signed)
Report given to PACU, vss 

## 2018-04-23 ENCOUNTER — Telehealth: Payer: Self-pay

## 2018-04-23 NOTE — Telephone Encounter (Signed)
  Follow up Call-  Call back number 04/22/2018  Post procedure Call Back phone  # [phone]  Permission to leave phone message Yes  Some recent data might be hidden     Patient questions:  Do you have a fever, pain , or abdominal swelling? No. Pain Score  0 *  Have you tolerated food without any problems? Yes.    Have you been able to return to your normal activities? Yes.    Do you have any questions about your discharge instructions: Diet   No. Medications  No. Follow up visit  No.  Do you have questions or concerns about your Care? No.  Actions: * If pain score is 4 or above: No action needed, pain <4.  No problems noted per pt. maw

## 2018-04-27 ENCOUNTER — Telehealth: Payer: Self-pay | Admitting: Internal Medicine

## 2018-04-27 NOTE — Telephone Encounter (Signed)
I called Eric Ford to discuss colonoscopy results. It was noted that he would require a repeat colonoscopy in 1-2 months due to inadequate bowel prep. He expressed understanding and will call Gi to make an appointment.

## 2018-05-11 ENCOUNTER — Encounter: Payer: Medicare Other | Admitting: Gastroenterology

## 2018-05-22 ENCOUNTER — Ambulatory Visit (AMBULATORY_SURGERY_CENTER): Payer: Self-pay

## 2018-05-22 VITALS — Ht 66.0 in | Wt 147.8 lb

## 2018-05-22 DIAGNOSIS — Z86010 Personal history of colonic polyps: Secondary | ICD-10-CM

## 2018-05-22 DIAGNOSIS — Z800 Family history of malignant neoplasm of digestive organs: Secondary | ICD-10-CM

## 2018-05-22 MED ORDER — PEG 3350-KCL-NA BICARB-NACL 420 G PO SOLR: 4000.0000 mL | Freq: Once | ORAL | 0 refills | Status: AC

## 2018-05-22 NOTE — Progress Notes (Signed)
No egg or soy allergy known to patient  No issues with past sedation with any surgeries  or procedures, no intubation problems  No diet pills per patient No home 02 use per patient  No blood thinners per patient  Pt denies issues with constipation  No A fib or A flutter  EMMI video sent to pt's e mail  Pt. declined 

## 2018-05-29 ENCOUNTER — Ambulatory Visit (AMBULATORY_SURGERY_CENTER): Payer: Medicare Other | Admitting: Gastroenterology

## 2018-05-29 ENCOUNTER — Encounter: Payer: Self-pay | Admitting: Gastroenterology

## 2018-05-29 VITALS — BP 100/55 | HR 88 | Temp 98.6°F | Resp 17 | Ht 66.0 in | Wt 147.0 lb

## 2018-05-29 DIAGNOSIS — Z86010 Personal history of colonic polyps: Secondary | ICD-10-CM | POA: Diagnosis not present

## 2018-05-29 DIAGNOSIS — K635 Polyp of colon: Secondary | ICD-10-CM

## 2018-05-29 DIAGNOSIS — D120 Benign neoplasm of cecum: Secondary | ICD-10-CM | POA: Diagnosis not present

## 2018-05-29 DIAGNOSIS — Z1211 Encounter for screening for malignant neoplasm of colon: Secondary | ICD-10-CM | POA: Diagnosis not present

## 2018-05-29 DIAGNOSIS — D123 Benign neoplasm of transverse colon: Secondary | ICD-10-CM

## 2018-05-29 DIAGNOSIS — D124 Benign neoplasm of descending colon: Secondary | ICD-10-CM | POA: Diagnosis not present

## 2018-05-29 DIAGNOSIS — D125 Benign neoplasm of sigmoid colon: Secondary | ICD-10-CM

## 2018-05-29 DIAGNOSIS — D122 Benign neoplasm of ascending colon: Secondary | ICD-10-CM | POA: Diagnosis not present

## 2018-05-29 MED ORDER — SODIUM CHLORIDE 0.9 % IV SOLN: 500.0000 mL | Freq: Once | INTRAVENOUS | Status: DC

## 2018-05-29 NOTE — Progress Notes (Signed)
A and O x3. Report to RN. Tolerated MAC anesthesia well.

## 2018-05-29 NOTE — Progress Notes (Signed)
Called to room to assist during endoscopic procedure.  Patient ID and intended procedure confirmed with present staff. Received instructions for my participation in the procedure from the performing physician.  

## 2018-05-29 NOTE — Patient Instructions (Signed)
Handouts given on polyps. You had 12 polyps removed today.    YOU HAD AN ENDOSCOPIC PROCEDURE TODAY AT [facility]:   Refer to the procedure report that was given to you for any specific questions about what was found during the examination.  If the procedure report does not answer your questions, please call your gastroenterologist to clarify.  If you requested that your care partner not be given the details of your procedure findings, then the procedure report has been included in a sealed envelope for you to review at your convenience later.  YOU SHOULD EXPECT: Some feelings of bloating in the abdomen. Passage of more gas than usual.  Walking can help get rid of the air that was put into your GI tract during the procedure and reduce the bloating. If you had a lower endoscopy (such as a colonoscopy or flexible sigmoidoscopy) you may notice spotting of blood in your stool or on the toilet paper. If you underwent a bowel prep for your procedure, you may not have a normal bowel movement for a few days.  Please Note:  You might notice some irritation and congestion in your nose or some drainage.  This is from the oxygen used during your procedure.  There is no need for concern and it should clear up in a day or so.  SYMPTOMS TO REPORT IMMEDIATELY:   Following lower endoscopy (colonoscopy or flexible sigmoidoscopy):  Excessive amounts of blood in the stool  Significant tenderness or worsening of abdominal pains  Swelling of the abdomen that is new, acute  Fever of 100F or higher   For urgent or emergent issues, a gastroenterologist can be reached at any hour by calling [phone].   DIET:  We do recommend a small meal at first, but then you may proceed to your regular diet.  Drink plenty of fluids but you should avoid alcoholic beverages for 24 hours.  ACTIVITY:  You should plan to take it easy for the rest of today and you should NOT DRIVE or use heavy machinery until  tomorrow (because of the sedation medicines used during the test).    FOLLOW UP: Our staff will call the number listed on your records the next business day following your procedure to check on you and address any questions or concerns that you may have regarding the information given to you following your procedure. If we do not reach you, we will leave a message.  However, if you are feeling well and you are not experiencing any problems, there is no need to return our call.  We will assume that you have returned to your regular daily activities without incident.  If any biopsies were taken you will be contacted by phone or by letter within the next 1-3 weeks.  Please call us at [phone] if you have not heard about the biopsies in 3 weeks.    SIGNATURES/CONFIDENTIALITY: You and/or your care partner have signed paperwork which will be entered into your electronic medical record.  These signatures attest to the fact that that the information above on your After Visit Summary has been reviewed and is understood.  Full responsibility of the confidentiality of this discharge information lies with you and/or your care-partner.

## 2018-05-29 NOTE — Op Note (Signed)
[facility] Patient Name: Eric Ford Procedure Date: 05/29/2018 2:33 PM MRN: [id number] Endoscopist: Milus Banister , MD Age: 62 Referring MD:  Date of Birth: 10-26-1955 Gender: Male Account #: [id number] Procedure:                Colonoscopy Indications:              High risk colon cancer surveillance: Personal                            history of colonic polyps; colonoscopy 2012 Dr.                            Ardis Hughs three subCM adenomas removed; colonoscopy                            04/2018 4 polyps removed, 3 were adenomas, very poor                            prep Medicines:                Monitored Anesthesia Care Procedure:                Pre-Anesthesia Assessment:                           - Prior to the procedure, a History and Physical                            was performed, and patient medications and                            allergies were reviewed. The patient's tolerance of                            previous anesthesia was also reviewed. The risks                            and benefits of the procedure and the sedation                            options and risks were discussed with the patient.                            All questions were answered, and informed consent                            was obtained. Prior Anticoagulants: The patient has                            taken no previous anticoagulant or antiplatelet                            agents. ASA Grade Assessment: II - A patient with  mild systemic disease. After reviewing the risks                            and benefits, the patient was deemed in                            satisfactory condition to undergo the procedure.                           After obtaining informed consent, the colonoscope                            was passed under direct vision. Throughout the                            procedure, the patient's blood pressure, pulse, and                  oxygen saturations were monitored continuously. The                            Colonoscope was introduced through the anus and                            advanced to the the cecum, identified by                            appendiceal orifice and ileocecal valve. The                            colonoscopy was performed without difficulty. The                            patient tolerated the procedure well. The quality                            of the bowel preparation was good. The ileocecal                            valve, appendiceal orifice, and rectum were                            photographed. Scope In: 2:35:23 PM Scope Out: 2:54:45 PM Scope Withdrawal Time: 0 hours 17 minutes 1 second  Total Procedure Duration: 0 hours 19 minutes 22 seconds  Findings:                 Twelve sessile polyps were found in the sigmoid                            colon, descending colon, transverse colon,                            ascending colon and cecum. The polyps were 2 to 7  mm in size. These polyps were removed with a cold                            snare. Resection and retrieval were complete.                           The exam was otherwise without abnormality on                            direct and retroflexion views. Complications:            No immediate complications. Estimated blood loss:                            None. Estimated Blood Loss:     Estimated blood loss: none. Impression:               - Twelve 2 to 7 mm polyps in the sigmoid colon, in                            the descending colon, in the transverse colon, in                            the ascending colon and in the cecum, removed with                            a cold snare. Resected and retrieved.                           - The examination was otherwise normal on direct                            and retroflexion views. Recommendation:           - Patient has a contact number  available for                            emergencies. The signs and symptoms of potential                            delayed complications were discussed with the                            patient. Return to normal activities tomorrow.                            Written discharge instructions were provided to the                            patient.                           - Resume previous diet.                           - Continue present  medications.                           You will receive a letter within 2-3 weeks with the                            pathology results and my final recommendations.                           If the polyp(s) is proven to be 'pre-cancerous' on                            pathology, you will need repeat colonoscopy in 3                            years. Milus Banister, MD 05/29/2018 2:58:25 PM This report has been signed electronically.

## 2018-05-29 NOTE — Progress Notes (Signed)
Pt's states no medical or surgical changes since previsit or office visit. 

## 2018-06-01 ENCOUNTER — Telehealth: Payer: Self-pay

## 2018-06-01 NOTE — Telephone Encounter (Signed)
  Follow up Call-  Call back number 05/29/2018 04/22/2018  Post procedure Call Back phone  # [phone]  Permission to leave phone message Yes Yes  Some recent data might be hidden     Patient questions:  Do you have a fever, pain , or abdominal swelling? No. Pain Score  0 *  Have you tolerated food without any problems? Yes.    Have you been able to return to your normal activities? Yes.    Do you have any questions about your discharge instructions: Diet   No. Medications  No. Follow up visit  No.  Do you have questions or concerns about your Care? No.  Actions: * If pain score is 4 or above: No action needed, pain <4.

## 2018-06-08 ENCOUNTER — Encounter: Payer: Self-pay | Admitting: Gastroenterology

## 2018-06-08 ENCOUNTER — Other Ambulatory Visit: Payer: Self-pay

## 2018-06-08 DIAGNOSIS — D126 Benign neoplasm of colon, unspecified: Principal | ICD-10-CM

## 2018-06-08 DIAGNOSIS — Z1509 Genetic susceptibility to other malignant neoplasm: Secondary | ICD-10-CM

## 2018-07-07 ENCOUNTER — Telehealth: Payer: Self-pay | Admitting: Licensed Clinical Social Worker

## 2018-07-07 NOTE — Telephone Encounter (Signed)
Pt has been scheduled to see Faith Rogue for genetic counseling on 12/23 at 1pm. Aware to arrive 15 minutes early.

## 2018-07-08 ENCOUNTER — Other Ambulatory Visit: Payer: Self-pay

## 2018-07-08 DIAGNOSIS — B20 Human immunodeficiency virus [HIV] disease: Secondary | ICD-10-CM

## 2018-07-08 MED ORDER — DOLUTEGRAVIR SODIUM 50 MG PO TABS: ORAL_TABLET | ORAL | 1 refills | Status: DC

## 2018-07-08 MED ORDER — EMTRICITABINE-TENOFOVIR AF 200-25 MG PO TABS: 1.0000 | ORAL_TABLET | Freq: Every day | ORAL | 1 refills | Status: DC

## 2018-07-27 ENCOUNTER — Inpatient Hospital Stay: Payer: Medicare Other | Attending: Genetic Counselor | Admitting: Licensed Clinical Social Worker

## 2018-07-27 ENCOUNTER — Inpatient Hospital Stay: Payer: Medicare Other

## 2018-07-27 ENCOUNTER — Encounter: Payer: Self-pay | Admitting: Licensed Clinical Social Worker

## 2018-07-27 DIAGNOSIS — Z860100 Personal history of colon polyps, unspecified: Secondary | ICD-10-CM | POA: Insufficient documentation

## 2018-07-27 DIAGNOSIS — Z7183 Encounter for nonprocreative genetic counseling: Secondary | ICD-10-CM | POA: Diagnosis not present

## 2018-07-27 DIAGNOSIS — Z800 Family history of malignant neoplasm of digestive organs: Secondary | ICD-10-CM | POA: Diagnosis not present

## 2018-07-27 NOTE — Progress Notes (Signed)
REFERRING PROVIDER: Milus Banister, MD [address], [place] [address]  PRIMARY PROVIDER:  Jean Rosenthal, MD  PRIMARY REASON FOR VISIT:  1. Personal history of colonic polyps   2. Family history of colon cancer      HISTORY OF PRESENT ILLNESS:   Mr. Eric Ford, a 62 y.o. male, was seen for a [facility] [facility] cancer genetics consultation at the request of Dr. Ardis Hughs due to a personal history of colon polyps and family history of colon cancer. Mr. [name] presents to clinic today to discuss the possibility of a hereditary predisposition to cancer, genetic testing, and to further clarify his future cancer risks, as well as potential cancer risks for family members.   Mr. Eric Ford is a 62 y.o. male with no personal history of cancer. He had a colonoscopy in 2012 in which 3 adenomatous polyps were removed, and a colonoscopy in 2019 in which 12 adenomatous polyps were removed for a cumulative 15 adenomatous polyps.  The patient reports he quit smoking 3 weeks ago and has an occasional drink.   Past Medical History:  Diagnosis Date  . Blood transfusion without reported diagnosis 1980  . Family history of colon cancer   . HIV infection ([place])   . Personal history of colonic polyps   . Substance abuse [facility])     Past Surgical History:  Procedure Laterality Date  . COLONOSCOPY    . COLONOSCOPY     done [facility]  . POLYPECTOMY    . TRUNK WOUND REPAIR / CLOSURE      Social History   Socioeconomic History  . Marital status: Single    Spouse name: Not on file  . Number of children: Not on file  . Years of education: Not on file  . Highest education level: Not on file  Occupational History  . Not on file  Social Needs  . Financial resource strain: Not on file  . Food insecurity:    Worry: Not on file    Inability: Not on file  . Transportation needs:    Medical: Not on file    Non-medical: Not on file  Tobacco Use  . Smoking status: Current Every Day Smoker     Packs/day: 0.25    Types: Cigars    Last attempt to quit: 01/11/2009    Years since quitting: 9.5  . Smokeless tobacco: Never Used  . Tobacco comment: smokes 7-8 cigars daily  Substance and Sexual Activity  . Alcohol use: Yes    Alcohol/week: 0.0 standard drinks    Comment: very seldom  . Drug use: Not Currently    Types: Marijuana    Comment: rarely  . Sexual activity: Yes    Partners: Female    Comment: pt. given condoms  Lifestyle  . Physical activity:    Days per week: Not on file    Minutes per session: Not on file  . Stress: Not on file  Relationships  . Social connections:    Talks on phone: Not on file    Gets together: Not on file    Attends religious service: Not on file    Active member of club or organization: Not on file    Attends meetings of clubs or organizations: Not on file    Relationship status: Not on file  Other Topics Concern  . Not on file  Social History Narrative  . Not on file     FAMILY HISTORY:  We obtained a detailed,  4-generation family history.  Significant diagnoses are listed below: Family History  Problem Relation Age of Onset  . Cancer Mother        unknown type  . Heart disease Mother   . Hypertension Mother   . Heart attack Mother   . Hypertension Father   . Heart attack Father   . Cancer Sister   . Diabetes Sister   . Hyperlipidemia Sister   . Hypertension Sister   . Peripheral vascular disease Sister   . AAA (abdominal aortic aneurysm) Sister   . Hypertension Son   . Colon cancer Half-Brother        dx 74s  . Cancer Niece        stage IV, unk type  . Colon polyps Neg Hx   . Esophageal cancer Neg Hx   . Stomach cancer Neg Hx   . Rectal cancer Neg Hx   . Crohn's disease Neg Hx    Mr. Eric Ford has three sons, all in their 15s, and two daughters, one who is 1 and another is 3. No cancer history for his children. He has one full  Brother, age 53. He has five half brothers and four half sisters through his mom. One  of these half brothers had colon cancer in his 16s and is living at 19. One of his half sisters who is deceased had a daughter who recently passed away from stage IV cancer but the patient is unsure of what type or how old she was. He generally has limited information about his mother's side of the family.  Mr. Demaria's mother died at 77. He believes she had cancer. She had two brothers and two sisters, all deceased, who the patient does not believe had cancer. His maternal cousins do not have a history of cancer to his knowledge. His maternal grandparents died when they were "older."  Mr. Jefferys had no information about his father's side of the family. He believes his father died in his 73s or 84s but that is all he knows.   Mr. Melucci is unaware of previous family history of genetic testing for hereditary cancer risks. Patient's maternal ancestors are of American Indian/African American descent, and paternal ancestors are of African American descent. There is no reported Ashkenazi Jewish ancestry. There is no known consanguinity.  GENETIC COUNSELING ASSESSMENT: Eric Ford is a 62 y.o. male with a personal history of polyps which is somewhat suggestive of a Hereditary Polyposis Syndrome/Hereditary Cancer Syndrome We, therefore, discussed and recommended the following at today's visit.   DISCUSSION: We discussed that polyps in general are common, however, most people have fewer than 5 lifetime polyps.  When an individual has 10 or more polyps we become concerned about an underlying polyposis syndrome.  The most common hereditary polyposis syndromes are caused by problems in the APC and MUTYH genes, however, more recently, mutations in other genes have been identified in some polyposis families.     We reviewed the characteristics, features and inheritance patterns of hereditary cancer syndromes. We also discussed genetic testing, including the appropriate family members to test, the process of  testing, insurance coverage and turn-around-time for results. We discussed the implications of a negative, positive and/or variant of uncertain significant result. We recommended Mr. Eric Ford pursue testing for the [facility] Common Hereditary Cancers Panel + Colorectal Cancer Panel.   The Common Hereditary Cancers Panel offered by [facility] includes sequencing and/or deletion duplication testing of the following 47 genes: APC, ATM, AXIN2, BARD1, BMPR1A, BRCA1,  BRCA2, BRIP1, CDH1, CDKN2A (p14ARF), CDKN2A (p16INK4a), CKD4, CHEK2, CTNNA1, DICER1, EPCAM (Deletion/duplication testing only), GREM1 (promoter region deletion/duplication testing only), KIT, MEN1, MLH1, MSH2, MSH3, MSH6, MUTYH, NBN, NF1, NHTL1, PALB2, PDGFRA, PMS2, POLD1, POLE, PTEN, RAD50, RAD51C, RAD51D, SDHB, SDHC, SDHD, SMAD4, SMARCA4. STK11, TP53, TSC1, TSC2, and VHL.  The following genes were evaluated for sequence changes only: SDHA and HOXB13 c.251G>A variant only. The Colorectal Cancer Panel offered by [facility] includes sequencing and/or deletion duplication testing of the following 30 genes: APC, AXIN2, BMPR1A, CDH1, CHEK2, EPCAM, GREM1, MLH1, MSH2, MSH3, MSH6, MUTYH, NTHL1, PMS2, POLD1, POLE, PTEN, SMAD4, STK11, TP53, ATM, BLM, BUB1B, CEP57, ENG, FLCN, GALNT12, MLH3, RNF43, RPS20.  We discussed that if he is found to have a mutation in one of these genes, it may impact future medical management recommendations such as increased cancer screenings and consideration of risk reducing surgeries.  A positive result could also have implications for the patient's family members.   A Negative result would mean we were unable to identify a hereditary component to his development of adenomatous polyps, but does not rule out the possibility of a hereditary basis for his polyps. There could be mutations that are undetectable by current technology, or in genes not yet tested or identified to increase cancer risk.     We discussed the potential to find a  Variant of Uncertain Significance or VUS.  These are variants that have not yet been identified as pathogenic or benign, and it is unknown if this variant is associated with increased cancer risk or if this is a normal finding.  Most VUS's are reclassified to benign or likely benign.   It should not be used to make medical management decisions. With time, we suspect the lab will determine the significance of any VUS's identified if any.   Based on Mr. Bricco personal history of polyps, he meets NCCN medical criteria for genetic testing. Despite that he meets criteria, he may still have an out of pocket cost.   PLAN: Despite our recommendation, Mr. Ruffolo did not wish to pursue genetic testing at today's visit. He would like to think about it and said he would call if he decides to pursue testing. We understand this decision, and remain available to coordinate genetic testing at any time in the future. We; therefore, recommend Mr. Elahi continue to follow the cancer screening guidelines given by his primary healthcare provider.  Lastly, we encouraged Mr. Dubie to remain in contact with cancer genetics annually so that we can continuously update the family history and inform him of any changes in cancer genetics and testing that may be of benefit for this family.   Mr.  Maqueda questions were answered to his satisfaction today. Our contact information was provided should additional questions or concerns arise. Thank you for the referral and allowing [place] to share in the care of your patient.   Faith Rogue, MS Genetic Counselor [email or web address] Phone: [phone]  The patient was seen for a total of 30 minutes in face-to-face genetic counseling.

## 2018-09-25 ENCOUNTER — Other Ambulatory Visit: Payer: Self-pay | Admitting: Internal Medicine

## 2018-09-25 NOTE — Addendum Note (Signed)
Addended by: Orson Gear on: 09/25/2018 02:09 PM   Modules accepted: Orders

## 2018-10-06 ENCOUNTER — Other Ambulatory Visit: Payer: Self-pay | Admitting: Internal Medicine

## 2018-10-06 ENCOUNTER — Other Ambulatory Visit: Payer: Self-pay

## 2018-10-06 DIAGNOSIS — Z113 Encounter for screening for infections with a predominantly sexual mode of transmission: Secondary | ICD-10-CM

## 2018-10-06 DIAGNOSIS — B20 Human immunodeficiency virus [HIV] disease: Secondary | ICD-10-CM

## 2018-10-06 DIAGNOSIS — Z79899 Other long term (current) drug therapy: Secondary | ICD-10-CM

## 2018-10-09 ENCOUNTER — Other Ambulatory Visit: Payer: Medicare Other

## 2018-10-09 ENCOUNTER — Other Ambulatory Visit (HOSPITAL_COMMUNITY)
Admission: RE | Admit: 2018-10-09 | Discharge: 2018-10-09 | Disposition: A | Discharge status: Home / Self Care | Payer: Medicare Other | Source: Ambulatory Visit | Attending: Internal Medicine | Admitting: Internal Medicine

## 2018-10-09 DIAGNOSIS — B20 Human immunodeficiency virus [HIV] disease: Secondary | ICD-10-CM

## 2018-10-09 DIAGNOSIS — Z113 Encounter for screening for infections with a predominantly sexual mode of transmission: Secondary | ICD-10-CM | POA: Diagnosis present

## 2018-10-09 DIAGNOSIS — Z79899 Other long term (current) drug therapy: Secondary | ICD-10-CM

## 2018-10-09 LAB — T-HELPER CELL (CD4) - (RCID CLINIC ONLY)
CD4 % Helper T Cell: 24 % — ABNORMAL LOW (ref 33–55)
CD4 T Cell Abs: 570 /uL (ref 400–2700)

## 2018-10-10 ENCOUNTER — Emergency Department (HOSPITAL_COMMUNITY)
Admission: EM | Admit: 2018-10-10 | Discharge: 2018-10-10 | Disposition: A | Discharge status: Home / Self Care | Payer: Medicare Other | Attending: Emergency Medicine | Admitting: Emergency Medicine

## 2018-10-10 ENCOUNTER — Emergency Department (HOSPITAL_COMMUNITY): Payer: Medicare Other

## 2018-10-10 ENCOUNTER — Other Ambulatory Visit: Payer: Self-pay

## 2018-10-10 ENCOUNTER — Encounter (HOSPITAL_COMMUNITY): Payer: Self-pay | Admitting: Emergency Medicine

## 2018-10-10 DIAGNOSIS — R4789 Other speech disturbances: Secondary | ICD-10-CM | POA: Insufficient documentation

## 2018-10-10 DIAGNOSIS — R059 Cough, unspecified: Secondary | ICD-10-CM

## 2018-10-10 DIAGNOSIS — R29810 Facial weakness: Secondary | ICD-10-CM | POA: Diagnosis not present

## 2018-10-10 DIAGNOSIS — F17290 Nicotine dependence, other tobacco product, uncomplicated: Secondary | ICD-10-CM | POA: Diagnosis not present

## 2018-10-10 DIAGNOSIS — R079 Chest pain, unspecified: Secondary | ICD-10-CM | POA: Diagnosis not present

## 2018-10-10 DIAGNOSIS — E785 Hyperlipidemia, unspecified: Secondary | ICD-10-CM | POA: Diagnosis not present

## 2018-10-10 DIAGNOSIS — Z7982 Long term (current) use of aspirin: Secondary | ICD-10-CM | POA: Insufficient documentation

## 2018-10-10 DIAGNOSIS — G510 Bell's palsy: Secondary | ICD-10-CM | POA: Insufficient documentation

## 2018-10-10 DIAGNOSIS — R05 Cough: Secondary | ICD-10-CM | POA: Diagnosis not present

## 2018-10-10 DIAGNOSIS — Z79899 Other long term (current) drug therapy: Secondary | ICD-10-CM | POA: Insufficient documentation

## 2018-10-10 DIAGNOSIS — R0789 Other chest pain: Secondary | ICD-10-CM | POA: Insufficient documentation

## 2018-10-10 DIAGNOSIS — B20 Human immunodeficiency virus [HIV] disease: Secondary | ICD-10-CM | POA: Insufficient documentation

## 2018-10-10 DIAGNOSIS — I2510 Atherosclerotic heart disease of native coronary artery without angina pectoris: Secondary | ICD-10-CM | POA: Diagnosis not present

## 2018-10-10 LAB — BASIC METABOLIC PANEL
Anion gap: 8 (ref 5–15)
BUN: 11 mg/dL (ref 8–23)
CO2: 23 mmol/L (ref 22–32)
Calcium: 8.8 mg/dL — ABNORMAL LOW (ref 8.9–10.3)
Chloride: 106 mmol/L (ref 98–111)
Creatinine, Ser: 1.2 mg/dL (ref 0.61–1.24)
Glucose, Bld: 144 mg/dL — ABNORMAL HIGH (ref 70–99)
Potassium: 3.8 mmol/L (ref 3.5–5.1)
Sodium: 137 mmol/L (ref 135–145)

## 2018-10-10 LAB — CBC
HCT: 41.9 % (ref 39.0–52.0)
Hemoglobin: 13.2 g/dL (ref 13.0–17.0)
MCH: 27.1 pg (ref 26.0–34.0)
MCHC: 31.5 g/dL (ref 30.0–36.0)
MCV: 86 fL (ref 80.0–100.0)
Platelets: 197 10*3/uL (ref 150–400)
RBC: 4.87 MIL/uL (ref 4.22–5.81)
RDW: 13.4 % (ref 11.5–15.5)
WBC: 6.3 10*3/uL (ref 4.0–10.5)
nRBC: 0 % (ref 0.0–0.2)

## 2018-10-10 LAB — I-STAT TROPONIN, ED: Troponin i, poc: 0.01 ng/mL (ref 0.00–0.08)

## 2018-10-10 MED ORDER — PREDNISONE 20 MG PO TABS: 60.0000 mg | ORAL_TABLET | Freq: Every day | ORAL | 0 refills | Status: DC

## 2018-10-10 MED ORDER — VALACYCLOVIR HCL 500 MG PO TABS
1000.0000 mg | ORAL_TABLET | Freq: Once | ORAL | Status: AC
  Administered 2018-10-10: 1000 mg via ORAL
  Filled 2018-10-10: qty 2

## 2018-10-10 MED ORDER — PREDNISONE 20 MG PO TABS
60.0000 mg | ORAL_TABLET | Freq: Once | ORAL | Status: AC
  Administered 2018-10-10: 60 mg via ORAL
  Filled 2018-10-10: qty 3

## 2018-10-10 MED ORDER — VALACYCLOVIR HCL 1 G PO TABS: 1000.0000 mg | ORAL_TABLET | Freq: Three times a day (TID) | ORAL | 0 refills | Status: AC

## 2018-10-10 NOTE — ED Notes (Signed)
Patient transported to X-ray 

## 2018-10-10 NOTE — ED Provider Notes (Addendum)
[place] EMERGENCY DEPARTMENT Provider Note   CSN: [id number] Arrival date & time: 10/10/18  1053    History   Chief Complaint Chief Complaint  Patient presents with  . Chest Pain  . Facial Droop    HPI Eric Ford is a 63 y.o. male who presents with chest pain and facial droop. PMH significant for HIV (CD4 count 570), Hyperlipidemia, PAD. He states that he's had central substernal non-radiating chest pain for the past week. He thought it was congestion and has been taking cough medicine. He reports associated coughing which is somewhat productive. He has not had a fever or SOB. He also reports a globus sensation in his throat which he can't seem to get rid of. Last night he went bed in his usual state of health and when he woke up this morning he noted some right sided facial droop. He didn't think much of it until some members from the church commented on his face as well during bible study. This prompted him to come to the ED. He denies headache, dizziness, vision changes, difficulty talking, arm or leg weakness. He has had some intermittent "tingling" in the bilateral fingers that have been going on for some times. He also has this in his bilateral feet but it's not as bad.     HPI  Past Medical History:  Diagnosis Date  . Blood transfusion without reported diagnosis 1980  . Family history of colon cancer   . HIV infection ([place])   . Personal history of colonic polyps   . Substance abuse [facility])     Patient Active Problem List   Diagnosis Date Noted  . Personal history of colonic polyps   . Family history of colon cancer   . Screening examination for venereal disease 02/01/2016  . Xerosis of skin 08/09/2015  . Atherosclerosis of native arteries of the extremities with intermittent claudication 02/03/2014  . PAD (peripheral artery disease) ([place]) 01/17/2014  . Underweight 11/15/2013  . Essential hypertension, benign 10/12/2012  . Preventative health  care 03/14/2011  . Tobacco use 02/14/2009  . Tubular adenoma of colon 06/17/2008  . Dyslipidemia 01/18/2008  . GERD 01/18/2008  . Human immunodeficiency virus (HIV) disease ([place]) 06/17/2006  . Hepatitis C virus infection without hepatic coma 06/17/2006  . ERECTILE DYSFUNCTION 06/17/2006  . Cannabis abuse 06/17/2006  . ALCOHOL ABUSE, HX OF 06/17/2006  . HEPATITIS B, HX OF 06/17/2006    Past Surgical History:  Procedure Laterality Date  . COLONOSCOPY    . COLONOSCOPY     done years [facility] at [facility]  . POLYPECTOMY    . TRUNK WOUND REPAIR / CLOSURE          Home Medications    Prior to Admission medications   Medication Sig Start Date End Date Taking? Authorizing Provider  aspirin EC 81 MG tablet Take 1 tablet (81 mg total) by mouth daily. 01/18/14   Malena Catholic, MD  atorvastatin (LIPITOR) 40 MG tablet Take 1 tablet (40 mg total) by mouth daily. 02/13/18 02/13/19  Jean Rosenthal, MD  DESCOVY 200-25 MG tablet TAKE 1 TABLET BY MOUTH DAILY 10/06/18   Comer, Okey Regal, MD  TIVICAY 50 MG tablet TAKE 1 TABLET BY MOUTH EVERY DAY. STOP GENVOYA 10/06/18   ComerOkey Regal, MD    Family History Family History  Problem Relation Age of Onset  . Cancer Mother        unknown type  . Heart disease Mother   .  Hypertension Mother   . Heart attack Mother   . Hypertension Father   . Heart attack Father   . Cancer Sister   . Diabetes Sister   . Hyperlipidemia Sister   . Hypertension Sister   . Peripheral vascular disease Sister   . AAA (abdominal aortic aneurysm) Sister   . Hypertension Son   . Colon cancer Half-Brother        dx 69s  . Cancer Niece        stage IV, unk type  . Colon polyps Neg Hx   . Esophageal cancer Neg Hx   . Stomach cancer Neg Hx   . Rectal cancer Neg Hx   . Crohn's disease Neg Hx     Social History Social History   Tobacco Use  . Smoking status: Current Every Day Smoker    Packs/day: 0.25    Types: Cigars    Last attempt to quit: 01/11/2009     Years since quitting: 9.7  . Smokeless tobacco: Never Used  . Tobacco comment: smokes 7-8 cigars daily  Substance Use Topics  . Alcohol use: Yes    Alcohol/week: 0.0 standard drinks    Comment: very seldom  . Drug use: Not Currently    Types: Marijuana    Comment: rarely     Allergies   Patient has no known allergies.   Review of Systems Review of Systems  Constitutional: Negative for fever.  Eyes: Negative for visual disturbance.  Respiratory: Positive for cough. Negative for shortness of breath.   Cardiovascular: Positive for chest pain. Negative for palpitations and leg swelling.  Gastrointestinal: Negative for abdominal pain, diarrhea, nausea and vomiting.  Genitourinary: Negative for difficulty urinating and dysuria.  Musculoskeletal: Negative for arthralgias, back pain and myalgias.  Allergic/Immunologic: Positive for immunocompromised state.  Neurological: Positive for facial asymmetry, speech difficulty and weakness (facial). Negative for dizziness, seizures, syncope, light-headedness, numbness and headaches.  All other systems reviewed and are negative.    Physical Exam Updated Vital Signs BP (!) 158/85 (BP Location: Right Arm)   Pulse 70   Temp 97.7 F (36.5 C) (Oral)   Resp 18   Ht 5\' 6"  (1.676 m)   Wt 71.7 kg   SpO2 96%   BMI 25.50 kg/m   Physical Exam Vitals signs and nursing note reviewed.  Constitutional:      General: He is not in acute distress.    Appearance: Normal appearance. He is well-developed. He is not ill-appearing.     Comments: Calm and cooperative  HENT:     Head: Normocephalic and atraumatic.     Mouth/Throat:     Comments: Poor dentition Eyes:     General: No scleral icterus.       Right eye: No discharge.        Left eye: No discharge.     Conjunctiva/sclera: Conjunctivae normal.     Pupils: Pupils are equal, round, and reactive to light.  Neck:     Musculoskeletal: Normal range of motion and neck supple.  Cardiovascular:       Rate and Rhythm: Normal rate and regular rhythm.  Pulmonary:     Effort: Pulmonary effort is normal. No respiratory distress.     Breath sounds: Normal breath sounds.  Abdominal:     General: There is no distension.     Palpations: Abdomen is soft.     Tenderness: There is no abdominal tenderness.  Skin:    General: Skin is warm and dry.  Neurological:  Mental Status: He is alert and oriented to person, place, and time.     Comments: Mental Status:  Alert, oriented, thought content appropriate, able to give a coherent history. Speech fluent without evidence of aphasia. Able to follow 2 step commands without difficulty.  Cranial Nerves:  II:  Peripheral visual fields grossly normal, pupils equal, round, reactive to light III,IV, VI: ptosis not present, extra-ocular motions intact bilaterally  V,VII: Right sided facial droop VIII: hearing grossly normal to voice  X: uvula elevates symmetrically  XI: bilateral shoulder shrug symmetric and strong XII: midline tongue extension without fassiculations Motor:  Normal tone. 5/5 in upper and lower extremities bilaterally including strong and equal grip strength and dorsiflexion/plantar flexion Cerebellar: normal finger-to-nose with bilateral upper extremities Gait: normal gait and balance CV: distal pulses palpable throughout    Psychiatric:        Behavior: Behavior normal.      ED Treatments / Results  Labs (all labs ordered are listed, but only abnormal results are displayed) Labs Reviewed  BASIC METABOLIC PANEL - Abnormal; Notable for the following components:      Result Value   Glucose, Bld 144 (*)    Calcium 8.8 (*)    All other components within normal limits  CBC  I-STAT TROPONIN, ED    EKG EKG Interpretation  Date/Time:  Saturday October 10 2018 11:10:08 EST Ventricular Rate:  72 PR Interval:    QRS Duration: 90 QT Interval:  359 QTC Calculation: 393 R Axis:   16 Text Interpretation:  Sinus arrhythmia  Probable left atrial enlargement Abnormal R-wave progression, early transition Baseline wander in lead(s) V1 No STEMI.  Confirmed by Nanda Quinton [id number] on 10/10/2018 11:15:41 AM   Radiology Dg Chest 2 View  Result Date: 10/10/2018 CLINICAL DATA:  Chest pain EXAM: CHEST - 2 VIEW COMPARISON:  01/04/2011 FINDINGS: The heart size and mediastinal contours are within normal limits. Both lungs are clear. The visualized skeletal structures are unremarkable. IMPRESSION: No acute abnormality of the lungs.  No focal airspace opacity. Electronically Signed   By: Eddie Candle M.D.   On: 10/10/2018 12:21   Mr Brain Wo Contrast  Result Date: 10/10/2018 CLINICAL DATA:  Facial weakness. Awoke with right facial droop at 7 a.m. EXAM: MRI HEAD WITHOUT CONTRAST TECHNIQUE: Multiplanar, multiecho pulse sequences of the brain and surrounding structures were obtained without intravenous contrast. COMPARISON:  None. FINDINGS: Brain: No infarction, hemorrhage, hydrocephalus, extra-axial collection or mass lesion. No atrophy or white matter disease. No signal abnormality in the pons, cisterns, or right temporal bone. Symmetric (and atrophic) parotids. Vascular: Major flow voids are preserved Skull and upper cervical spine: Negative for marrow lesion Sinuses/Orbits: Negative IMPRESSION: Negative brain MRI.  No explanation for weakness. Electronically Signed   By: Monte Fantasia M.D.   On: 10/10/2018 13:17    Procedures Procedures (including critical care time)  Medications Ordered in ED Medications  predniSONE (DELTASONE) tablet 60 mg (60 mg Oral Given 10/10/18 1353)  valACYclovir (VALTREX) tablet 1,000 mg (1,000 mg Oral Given 10/10/18 1353)     Initial Impression / Assessment and Plan / ED Course  I have reviewed the triage vital signs and the nursing notes.  Pertinent labs & imaging results that were available during my care of the patient were reviewed by me and considered in my medical decision making (see chart for  details).  63 year old male presents with acute right-sided facial droop without extremity weakness as well as atypical sounding chest pain for the past  several days along with a cough.  He is hypertensive but otherwise vital signs are normal.  On exam he has obvious facial droop without evidence of extremity weakness.  Heart is regular rate and rhythm.  Lungs are clear to auscultation.  There is no lower extremity edema.  Will obtain labs, EKG, chest x-ray.  Discussed with Dr. Laverta Baltimore.  Will obtain MRI of the brain.  The patient's sister, brother, son and the son's GF are now at bedside.  Discussed all results with the patient and his family.  He likely has Bell's palsy.  Will treat with high-dose Valtrex and prednisone for 1 week.  He was given neurology follow-up.  In regards to his chest pain sounds atypical and is likely due to his cough.  CBC is normal.  BMP is remarkable for mild hyperglycemia.  Troponin is normal.  Chest x-ray is normal.  EKG is normal. Do not think he needs 2nd trop since he has had chest pain for several days. The tingling in his hands is a chronic issues and sounds like neuropathy - possibly from HIV. He can f/u with his doctor about this. All questions were answered and pt was discharged in good condition.  Final Clinical Impressions(s) / ED Diagnoses   Final diagnoses:  Bell's palsy  Facial droop  Atypical chest pain  Cough    ED Discharge Orders    None          Recardo Evangelist, PA-C 10/10/18 1412    Long, Wonda Olds, MD 10/10/18 1920

## 2018-10-10 NOTE — Discharge Instructions (Signed)
Take Valtrex three times a day for one week Take Prednisone once a day for one week Please follow up with a neurologist Use eye drops if you get eye irritation or dryness Return if worsening

## 2018-10-10 NOTE — ED Triage Notes (Signed)
Patient presents c/o central chest pain onset of 3 days ago. Patient also woke up with right sided facial droop at 7am. Patient states it was normal when he went to bed last night around 9-10pm. Patient also reports right sided arm tingling intermittently over the past week. Patient alert and orientated x4.

## 2018-10-10 NOTE — ED Notes (Signed)
Patient transported to MRI 

## 2018-10-12 LAB — COMPLETE METABOLIC PANEL WITH GFR
AG RATIO: 1 (calc) (ref 1.0–2.5)
ALT: 28 U/L (ref 9–46)
AST: 34 U/L (ref 10–35)
Albumin: 4 g/dL (ref 3.6–5.1)
Alkaline phosphatase (APISO): 85 U/L (ref 35–144)
BUN: 12 mg/dL (ref 7–25)
CO2: 24 mmol/L (ref 20–32)
Calcium: 9.2 mg/dL (ref 8.6–10.3)
Chloride: 101 mmol/L (ref 98–110)
Creat: 1.22 mg/dL (ref 0.70–1.25)
GFR, Est African American: 72 mL/min/{1.73_m2} (ref >=60)
GFR, Est Non African American: 62 mL/min/{1.73_m2} (ref >=60)
Globulin: 3.9 g/dL (calc) — ABNORMAL HIGH (ref 1.9–3.7)
Glucose, Bld: 277 mg/dL — ABNORMAL HIGH (ref 65–99)
Potassium: 4.2 mmol/L (ref 3.5–5.3)
Sodium: 134 mmol/L — ABNORMAL LOW (ref 135–146)
Total Bilirubin: 0.4 mg/dL (ref 0.2–1.2)
Total Protein: 7.9 g/dL (ref 6.1–8.1)

## 2018-10-12 LAB — CBC WITH DIFFERENTIAL/PLATELET
ABSOLUTE MONOCYTES: 655 {cells}/uL (ref 200–950)
Basophils Absolute: 50 cells/uL (ref 0–200)
Basophils Relative: 0.7 %
Eosinophils Absolute: 101 cells/uL (ref 15–500)
Eosinophils Relative: 1.4 %
HCT: 40.4 % (ref 38.5–50.0)
Hemoglobin: 13.4 g/dL (ref 13.2–17.1)
Lymphs Abs: 2448 cells/uL (ref 850–3900)
MCH: 27.1 pg (ref 27.0–33.0)
MCHC: 33.2 g/dL (ref 32.0–36.0)
MCV: 81.6 fL (ref 80.0–100.0)
MPV: 11.6 fL (ref 7.5–12.5)
Monocytes Relative: 9.1 %
Neutro Abs: 3946 cells/uL (ref 1500–7800)
Neutrophils Relative %: 54.8 %
Platelets: 217 10*3/uL (ref 140–400)
RBC: 4.95 10*6/uL (ref 4.20–5.80)
RDW: 13 % (ref 11.0–15.0)
Total Lymphocyte: 34 %
WBC: 7.2 10*3/uL (ref 3.8–10.8)

## 2018-10-12 LAB — HIV-1 RNA QUANT-NO REFLEX-BLD
HIV 1 RNA Quant: <20 copies/mL
HIV-1 RNA Quant, Log: <1.3 Log copies/mL

## 2018-10-12 LAB — LIPID PANEL
Cholesterol: 150 mg/dL (ref <200)
HDL: 26 mg/dL — ABNORMAL LOW (ref >=40)
LDL Cholesterol (Calc): 88 mg/dL (calc)
Non-HDL Cholesterol (Calc): 124 mg/dL (calc) (ref <130)
Total CHOL/HDL Ratio: 5.8 (calc) — ABNORMAL HIGH (ref <5.0)
Triglycerides: 275 mg/dL — ABNORMAL HIGH (ref <150)

## 2018-10-12 LAB — RPR: RPR Ser Ql: NONREACTIVE

## 2018-10-12 LAB — URINE CYTOLOGY ANCILLARY ONLY
Chlamydia: NEGATIVE
Neisseria Gonorrhea: NEGATIVE

## 2018-10-26 ENCOUNTER — Telehealth: Payer: Self-pay | Admitting: *Deleted

## 2018-10-26 ENCOUNTER — Encounter: Payer: Self-pay | Admitting: Neurology

## 2018-10-27 ENCOUNTER — Ambulatory Visit: Payer: Medicare Other | Admitting: Internal Medicine

## 2018-11-17 ENCOUNTER — Other Ambulatory Visit: Payer: Self-pay

## 2018-11-17 ENCOUNTER — Ambulatory Visit (INDEPENDENT_AMBULATORY_CARE_PROVIDER_SITE_OTHER): Payer: Medicare Other | Admitting: Internal Medicine

## 2018-11-17 ENCOUNTER — Encounter: Payer: Self-pay | Admitting: Internal Medicine

## 2018-11-17 DIAGNOSIS — G510 Bell's palsy: Secondary | ICD-10-CM | POA: Diagnosis not present

## 2018-11-17 DIAGNOSIS — B20 Human immunodeficiency virus [HIV] disease: Secondary | ICD-10-CM | POA: Diagnosis not present

## 2018-11-17 DIAGNOSIS — R739 Hyperglycemia, unspecified: Secondary | ICD-10-CM | POA: Diagnosis not present

## 2018-11-17 HISTORY — DX: Hyperglycemia, unspecified: R73.9

## 2018-11-17 MED ORDER — BICTEGRAVIR-EMTRICITAB-TENOFOV 50-200-25 MG PO TABS: 1.0000 | ORAL_TABLET | Freq: Every day | ORAL | 11 refills | Status: DC

## 2018-11-17 NOTE — Assessment & Plan Note (Signed)
This has persisted by his report though no associated symptoms.  He has neurology appt scheduled and he is aware knows to keep the appt.

## 2018-11-17 NOTE — Progress Notes (Signed)
   Subjective:    Patient ID: Eric Ford, male    DOB: 12/23/1955, 63 y.o.   MRN: [id number]  HPI Evisit scheduled after a recent ED visit where he was diagnosed with Bell's Palsy.  He has HIV and also had recent labs with a CD4 of 570 and viral load < 20.  He has continued on Tivicay and Descovy and denies any missed doses.  He has not complaints of the medication though now is interested in considering the one pill a day option.  His Bell's Palsy persists but no associated headache, no vision issues, no slurred speech, no weakness noted.  His glucose was noted to be up, he has not been to see his PCP.     Review of Systems  Constitutional: Negative for fatigue.  Gastrointestinal: Negative for diarrhea.  Skin: Negative for rash.       Objective:   Physical Exam Neurological:     Comments: Over the phone he has clear, fluid speech.            Assessment & Plan:

## 2018-11-17 NOTE — Assessment & Plan Note (Addendum)
He is doing well and I will change him to [facility] for a one pill a day option.  He will keep his June follow up on the new medication with labs prior to the visit.    He is due for Pneumovax and Prevnar  23 minutes spent on phone/Evisit.

## 2018-11-17 NOTE — Assessment & Plan Note (Signed)
I will check a Hgb A1c and he is going to schedule with his PCP

## 2018-11-26 NOTE — Telephone Encounter (Signed)
review 

## 2018-12-01 ENCOUNTER — Other Ambulatory Visit: Payer: Self-pay | Admitting: Internal Medicine

## 2018-12-01 DIAGNOSIS — B20 Human immunodeficiency virus [HIV] disease: Secondary | ICD-10-CM

## 2019-01-04 NOTE — Progress Notes (Signed)
NEUROLOGY CONSULTATION NOTE  Eric Ford MRN: [id number] DOB: 06/17/1956  Referring provider: Nanda Quinton, MD (ED referral) Primary care provider: Merlinda Frederick, MD  Reason for consult:  Bell's palsy  HISTORY OF PRESENT ILLNESS: Eric Ford is a 63 year old right-handed black man with HIV who presents for right-sided Bell's palsy.  History supplemented by ED note.  He presented to the [facility] ED on 10/10/18 with chest pain and facial droop.  He had been experiencing chest pain and cough but no fever or SOB.  He woke up that morning and noted right sided facial droop.  He did not have associated headache, dizziness, slurred speech, visual disturbance, ear pain, hyperacusis or facial numbness.  Cardiac workup (EKG, troponin) and CXR were negative.  MRI of brain without contrast was personally reviewed and was negative. Serum glucose 277.  He has HIV with CD4 570 and viral load less than 20.  He was treated with Valtrex and prednisone.  Symptoms have improved.  Once in awhile it feels like his mouth is twisted.  PAST MEDICAL HISTORY: Past Medical History:  Diagnosis Date  . Blood transfusion without reported diagnosis 1980  . Family history of colon cancer   . HIV infection ([place])   . Personal history of colonic polyps   . Substance abuse ([place])     PAST SURGICAL HISTORY: Past Surgical History:  Procedure Laterality Date  . COLONOSCOPY    . COLONOSCOPY     done years ago at [facility]  . POLYPECTOMY    . TRUNK WOUND REPAIR / CLOSURE      MEDICATIONS: Current Outpatient Medications on File Prior to Visit  Medication Sig Dispense Refill  . aspirin EC 81 MG tablet Take 1 tablet (81 mg total) by mouth daily. 100 tablet 3  . atorvastatin (LIPITOR) 40 MG tablet Take 1 tablet (40 mg total) by mouth daily. 30 tablet 11  . bictegravir-emtricitabine-tenofovir AF (BIKTARVY) 50-200-25 MG TABS tablet Take 1 tablet by mouth daily. 30 tablet 11  . predniSONE (DELTASONE) 20 MG tablet Take 3  tablets (60 mg total) by mouth daily. 21 tablet 0   No current facility-administered medications on file prior to visit.     ALLERGIES: No Known Allergies  FAMILY HISTORY: Family History  Problem Relation Age of Onset  . Cancer Mother        unknown type  . Heart disease Mother   . Hypertension Mother   . Heart attack Mother   . Hypertension Father   . Heart attack Father   . Cancer Sister   . Diabetes Sister   . Hyperlipidemia Sister   . Hypertension Sister   . Peripheral vascular disease Sister   . AAA (abdominal aortic aneurysm) Sister   . Hypertension Son   . Colon cancer Half-Brother        dx 41s  . Cancer Niece        stage IV, unk type  . Colon polyps Neg Hx   . Esophageal cancer Neg Hx   . Stomach cancer Neg Hx   . Rectal cancer Neg Hx   . Crohn's disease Neg Hx    SOCIAL HISTORY: Social History   Socioeconomic History  . Marital status: Single    Spouse name: Not on file  . Number of children: Not on file  . Years of education: Not on file  . Highest education level: Not on file  Occupational History  . Not on file  Social Needs  .  Financial resource strain: Not on file  . Food insecurity:    Worry: Not on file    Inability: Not on file  . Transportation needs:    Medical: Not on file    Non-medical: Not on file  Tobacco Use  . Smoking status: Former Smoker    Packs/day: 0.25    Types: Cigars    Last attempt to quit: 01/11/2009    Years since quitting: 9.9  . Smokeless tobacco: Never Used  . Tobacco comment: smokes 7-8 cigars daily  Substance and Sexual Activity  . Alcohol use: Yes    Alcohol/week: 0.0 standard drinks    Comment: very seldom  . Drug use: Not Currently    Types: Marijuana    Comment: rarely  . Sexual activity: Yes    Partners: Female    Comment: pt. given condoms  Lifestyle  . Physical activity:    Days per week: Not on file    Minutes per session: Not on file  . Stress: Not on file  Relationships  . Social  connections:    Talks on phone: Not on file    Gets together: Not on file    Attends religious service: Not on file    Active member of club or organization: Not on file    Attends meetings of clubs or organizations: Not on file    Relationship status: Not on file  . Intimate partner violence:    Fear of current or ex partner: Not on file    Emotionally abused: Not on file    Physically abused: Not on file    Forced sexual activity: Not on file  Other Topics Concern  . Not on file  Social History Narrative  . Not on file    REVIEW OF SYSTEMS: Constitutional: No fevers, chills, or sweats, no generalized fatigue, change in appetite Eyes: No visual changes, double vision, eye pain Ear, nose and throat: No hearing loss, ear pain, nasal congestion, sore throat Cardiovascular: No chest pain, palpitations Respiratory:  No shortness of breath at rest or with exertion, wheezes GastrointestinaI: No nausea, vomiting, diarrhea, abdominal pain, fecal incontinence Genitourinary:  No dysuria, urinary retention or frequency Musculoskeletal:  No neck pain, back pain Integumentary: No rash, pruritus, skin lesions Neurological: as above Psychiatric: No depression, insomnia, anxiety Endocrine: No palpitations, fatigue, diaphoresis, mood swings, change in appetite, change in weight, increased thirst Hematologic/Lymphatic:  No purpura, petechiae. Allergic/Immunologic: no itchy/runny eyes, nasal congestion, recent allergic reactions, rashes  PHYSICAL EXAM: Blood pressure 128/66, pulse 75, temperature 98.6 F (37 C), height 5\' 6"  (1.676 m), weight 161 lb (73 kg), SpO2 96 %. General: No acute distress.  Patient appears well-groomed.   Head:  Normocephalic/atraumatic Eyes:  fundi examined but not visualized Neck: supple, no paraspinal tenderness, full range of motion Back: No paraspinal tenderness Heart: regular rate and rhythm Lungs: Clear to auscultation bilaterally. Vascular: No carotid bruits.  Neurological Exam: Mental status: alert and oriented to person, place, and time, recent and remote memory intact, fund of knowledge intact, attention and concentration intact, speech fluent and not dysarthric, language intact. Cranial nerves: CN I: not tested CN II: pupils equal, round and reactive to light, visual fields intact CN III, IV, VI:  full range of motion, no nystagmus, no ptosis CN V: facial sensation intact CN VII: slight right sided droop of the mouth. CN VIII: hearing intact CN IX, X: gag intact, uvula midline CN XI: sternocleidomastoid and trapezius muscles intact CN XII: tongue midline Bulk &  Tone: normal, no fasciculations. Motor:  5/5 throughout Sensation:  temperature and vibration sensation intact. Deep Tendon Reflexes:  2+ throughout, toes downgoing.  Finger to nose testing:  Without dysmetria.  Heel to shin:  Without dysmetria.  Gait:  Normal station and stride. Romberg negative.  IMPRESSION: Right sided Bell's palsy, improved.  Hyperglycemia.  PCP may want to evaluate for DM HIV, followed by Dr. Linus Salmons  No further recommendations. No follow up needed.  Thank you for allowing me to take part in the care of this patient.  Metta Clines, DO  CC: Nathanial Rancher, MD

## 2019-01-05 ENCOUNTER — Other Ambulatory Visit: Payer: Self-pay

## 2019-01-05 ENCOUNTER — Encounter: Payer: Self-pay | Admitting: Neurology

## 2019-01-05 ENCOUNTER — Ambulatory Visit (INDEPENDENT_AMBULATORY_CARE_PROVIDER_SITE_OTHER): Payer: Medicare Other | Admitting: Neurology

## 2019-01-05 VITALS — BP 128/66 | HR 75 | Temp 98.6°F | Ht 66.0 in | Wt 161.0 lb

## 2019-01-05 DIAGNOSIS — R739 Hyperglycemia, unspecified: Secondary | ICD-10-CM

## 2019-01-05 DIAGNOSIS — G510 Bell's palsy: Secondary | ICD-10-CM

## 2019-01-05 DIAGNOSIS — B20 Human immunodeficiency virus [HIV] disease: Secondary | ICD-10-CM

## 2019-01-05 NOTE — Patient Instructions (Signed)
Bell Palsy, Adult  Bell palsy is a short-term inability to move muscles in part of the face. The inability to move (paralysis) results from inflammation or compression of the facial nerve, which travels along the skull and under the ear to the side of the face (7th cranial nerve). This nerve is responsible for facial movements that include blinking, closing the eyes, smiling, and frowning. What are the causes? The exact cause of this condition is not known. It may be caused by an infection from a virus, such as the chickenpox (herpes zoster), Epstein-Barr, or mumps virus. What increases the risk? You are more likely to develop this condition if:  You are pregnant.  You have diabetes.  You have had a recent infection in your nose, throat, or airways (upper respiratory infection).  You have a weakened body defense system (immune system).  You have had a facial injury, such as a fracture.  You have a family history of Bell palsy. What are the signs or symptoms? Symptoms of this condition include:  Weakness on one side of the face.  Drooping eyelid and corner of the mouth.  Excessive tearing in one eye.  Difficulty closing the eyelid.  Dry eye.  Drooling.  Dry mouth.  Changes in taste.  Change in facial appearance.  Pain behind one ear.  Ringing in one or both ears.  Sensitivity to sound in one ear.  Facial twitching.  Headache.  Impaired speech.  Dizziness.  Difficulty eating or drinking. Most of the time, only one side of the face is affected. Rarely, Bell palsy affects the whole face. How is this diagnosed? This condition is diagnosed based on:  Your symptoms.  Your medical history.  A physical exam. You may also have to see health care providers who specialize in disorders of the nerves (neurologist) or diseases and conditions of the eye (ophthalmologist). You may have tests, such as:  A test to check for nerve damage (electromyogram).  Imaging  studies, such as CT or MRI scans.  Blood tests. How is this treated? This condition affects every person differently. Sometimes symptoms go away without treatment within a couple weeks. If treatment is needed, it varies from person to person. The goal of treatment is to reduce inflammation and protect the eye from damage. Treatment for Bell palsy may include:  Medicines, such as: ? Steroids to reduce swelling and inflammation. ? Antiviral drugs. ? Pain relievers, including aspirin, acetaminophen, or ibuprofen.  Eye drops or ointment to keep your eye moist.  Eye protection, if you cannot close your eye.  Exercises or massage to regain muscle strength and function (physical therapy). Follow these instructions at home:   Take over-the-counter and prescription medicines only as told by your health care provider.  If your eye is affected: ? Keep your eye moist with eye drops or ointment as told by your health care provider. ? Follow instructions for eye care and protection as told by your health care provider.  Do any physical therapy exercises as told by your health care provider.  Keep all follow-up visits as told by your health care provider. This is important. Contact a health care provider if:  You have a fever.  Your symptoms do not get better within 2-3 weeks, or your symptoms get worse.  Your eye is red, irritated, or painful.  You have new symptoms. Get help right away if:  You have weakness or numbness in a part of your body other than your face.  You have   trouble swallowing.  You develop neck pain or stiffness.  You develop dizziness or shortness of breath. Summary  Bell palsy is a short-term inability to move muscles in part of the face. The inability to move (paralysis) results from inflammation or compression of the facial nerve.  This condition affects every person differently. Sometimes symptoms go away without treatment within a couple weeks.  If  treatment is needed, it varies from person to person. The goal of treatment is to reduce inflammation and protect the eye from damage.  Contact your health care provider if your symptoms do not get better within 2-3 weeks, or your symptoms get worse. This information is not intended to replace advice given to you by your health care provider. Make sure you discuss any questions you have with your health care provider. Document Released: 07/22/2005 Document Revised: 06/20/2017 Document Reviewed: 09/24/2016 Elsevier Interactive Patient Education  2019 Elsevier Inc.  

## 2019-01-19 ENCOUNTER — Other Ambulatory Visit: Payer: Medicare Other

## 2019-01-20 ENCOUNTER — Other Ambulatory Visit: Payer: Self-pay

## 2019-01-20 ENCOUNTER — Other Ambulatory Visit: Payer: Medicare Other

## 2019-01-20 DIAGNOSIS — R739 Hyperglycemia, unspecified: Secondary | ICD-10-CM

## 2019-01-20 DIAGNOSIS — B20 Human immunodeficiency virus [HIV] disease: Secondary | ICD-10-CM

## 2019-01-21 LAB — T-HELPER CELL (CD4) - (RCID CLINIC ONLY)
CD4 % Helper T Cell: 29 % — ABNORMAL LOW (ref 33–65)
CD4 T Cell Abs: 727 /uL (ref 400–1790)

## 2019-01-26 LAB — COMPLETE METABOLIC PANEL WITH GFR
AG Ratio: 1 (calc) (ref 1.0–2.5)
ALT: 30 U/L (ref 9–46)
AST: 33 U/L (ref 10–35)
Albumin: 3.7 g/dL (ref 3.6–5.1)
Alkaline phosphatase (APISO): 72 U/L (ref 35–144)
BUN: 12 mg/dL (ref 7–25)
CO2: 23 mmol/L (ref 20–32)
Calcium: 9 mg/dL (ref 8.6–10.3)
Chloride: 101 mmol/L (ref 98–110)
Creat: 1.25 mg/dL (ref 0.70–1.25)
GFR, Est African American: 70 mL/min/{1.73_m2} (ref >=60)
GFR, Est Non African American: 60 mL/min/{1.73_m2} (ref >=60)
Globulin: 3.7 g/dL (calc) (ref 1.9–3.7)
Glucose, Bld: 286 mg/dL — ABNORMAL HIGH (ref 65–99)
Potassium: 4.3 mmol/L (ref 3.5–5.3)
Sodium: 136 mmol/L (ref 135–146)
Total Bilirubin: 0.3 mg/dL (ref 0.2–1.2)
Total Protein: 7.4 g/dL (ref 6.1–8.1)

## 2019-01-26 LAB — HEMOGLOBIN A1C
Hgb A1c MFr Bld: 9.4 % of total Hgb — ABNORMAL HIGH (ref <5.7)
Mean Plasma Glucose: 223 (calc)
eAG (mmol/L): 12.4 (calc)

## 2019-01-26 LAB — HIV-1 RNA QUANT-NO REFLEX-BLD
HIV 1 RNA Quant: <20 copies/mL
HIV-1 RNA Quant, Log: <1.3 Log copies/mL

## 2019-02-02 ENCOUNTER — Other Ambulatory Visit: Payer: Self-pay

## 2019-02-02 ENCOUNTER — Encounter: Payer: Self-pay | Admitting: Internal Medicine

## 2019-02-02 ENCOUNTER — Ambulatory Visit (INDEPENDENT_AMBULATORY_CARE_PROVIDER_SITE_OTHER): Payer: Medicare Other | Admitting: Internal Medicine

## 2019-02-02 VITALS — BP 147/84 | HR 72 | Temp 97.9°F | Wt 158.0 lb

## 2019-02-02 DIAGNOSIS — Z113 Encounter for screening for infections with a predominantly sexual mode of transmission: Secondary | ICD-10-CM | POA: Diagnosis not present

## 2019-02-02 DIAGNOSIS — Z23 Encounter for immunization: Secondary | ICD-10-CM

## 2019-02-02 DIAGNOSIS — R739 Hyperglycemia, unspecified: Secondary | ICD-10-CM

## 2019-02-02 DIAGNOSIS — E785 Hyperlipidemia, unspecified: Secondary | ICD-10-CM

## 2019-02-02 DIAGNOSIS — B20 Human immunodeficiency virus [HIV] disease: Secondary | ICD-10-CM

## 2019-02-02 DIAGNOSIS — G510 Bell's palsy: Secondary | ICD-10-CM

## 2019-02-02 NOTE — Assessment & Plan Note (Signed)
resolved 

## 2019-02-02 NOTE — Assessment & Plan Note (Signed)
Noted and needs to see his PCP asap

## 2019-02-02 NOTE — Assessment & Plan Note (Signed)
Doing well, no issues with Biktarvy.   rtc 6 months.

## 2019-02-02 NOTE — Progress Notes (Signed)
   Subjective:    Patient ID: Eric Ford, male    DOB: 1956-03-19, 63 y.o.   MRN: [id number]  HPI Follow up for HIV He was changed to [place] last visit from [place] and [place].  No new issues.  Did have Bell's Palsy and followed up with neurology Dr. Tomi Likens.  He was interested in one pill a day option and changed as above.  Glucose elevated but has not been back to his PCP. CD4 727, viral load < 20.  Creat wnl.     Review of Systems  Constitutional: Negative for fatigue and unexpected weight change.  Gastrointestinal: Negative for diarrhea.  Skin: Negative for rash.       Objective:   Physical Exam Constitutional:      Appearance: Normal appearance.  Eyes:     General: No scleral icterus. Cardiovascular:     Rate and Rhythm: Normal rate and regular rhythm.     Heart sounds: No murmur.  Pulmonary:     Effort: Pulmonary effort is normal.     Breath sounds: Normal breath sounds.  Skin:    Findings: No rash.  Neurological:     Mental Status: He is alert.    SH: previous tobacco       Assessment & Plan:

## 2019-02-03 ENCOUNTER — Other Ambulatory Visit: Payer: Self-pay | Admitting: Internal Medicine

## 2019-02-03 DIAGNOSIS — I739 Peripheral vascular disease, unspecified: Secondary | ICD-10-CM

## 2019-02-17 ENCOUNTER — Ambulatory Visit (INDEPENDENT_AMBULATORY_CARE_PROVIDER_SITE_OTHER): Payer: Medicare Other | Admitting: Internal Medicine

## 2019-02-17 ENCOUNTER — Encounter: Payer: Self-pay | Admitting: Internal Medicine

## 2019-02-17 ENCOUNTER — Other Ambulatory Visit: Payer: Self-pay

## 2019-02-17 DIAGNOSIS — Z86010 Personal history of colonic polyps: Secondary | ICD-10-CM

## 2019-02-17 DIAGNOSIS — Z79899 Other long term (current) drug therapy: Secondary | ICD-10-CM

## 2019-02-17 DIAGNOSIS — B20 Human immunodeficiency virus [HIV] disease: Secondary | ICD-10-CM

## 2019-02-17 DIAGNOSIS — D126 Benign neoplasm of colon, unspecified: Secondary | ICD-10-CM

## 2019-02-17 DIAGNOSIS — Z0000 Encounter for general adult medical examination without abnormal findings: Secondary | ICD-10-CM

## 2019-02-17 NOTE — Assessment & Plan Note (Signed)
Tubular adenoma: He recently had a colonoscopy for which 12 sessile polyps were resected.  Pathology result showed adenomatous polyps and he was instructed to have repeat colonoscopy in 1 year.  Plan: -Repeat colonoscopy in 1 year

## 2019-02-17 NOTE — Assessment & Plan Note (Signed)
HIV: He is currently on Biktarvy and endorses compliance to medication.  Has not had any recent infections.  His recent CD4 was 727 with undetectable HIV RNA.  He was recently started on Lipitor for hyperlipidemia and was concerned about it being a duplicate with this HIV medication.  I offered explanation and assistance.  Plan: -Continue follow-up with infectious disease -Continue [employer]

## 2019-02-17 NOTE — Progress Notes (Signed)
   CC: Follow-up HIV, confusion with medication  HPI:  Mr.Mahlik H Broz is a 63 y.o. with medical problems listed below presenting for clarification on medication.  Please see problem based charting for further details.  Past Medical History:  Diagnosis Date  . Blood transfusion without reported diagnosis 1980  . Family history of colon cancer   . HIV infection ([place])   . Personal history of colonic polyps   . Substance abuse ([place])    Review of Systems:  As per HPI  Physical Exam:  Vitals:   02/17/19 1550  BP: 129/76  Pulse: 83  Temp: 98.2 F (36.8 C)  TempSrc: Oral  SpO2: 98%  Weight: 160 lb 9.6 oz (72.8 kg)   Physical Exam Vitals signs and nursing note reviewed.  HENT:     Head: Normocephalic and atraumatic.  Cardiovascular:     Rate and Rhythm: Normal rate and regular rhythm.     Heart sounds: No murmur. No gallop.   Pulmonary:     Effort: Pulmonary effort is normal.     Breath sounds: Normal breath sounds. No wheezing, rhonchi or rales.  Musculoskeletal:     Right lower leg: No edema.     Left lower leg: No edema.  Neurological:     Mental Status: He is alert.     Assessment & Plan:   See Encounters Tab for problem based charting.  Patient discussed with Dr. Rebeca Alert

## 2019-02-17 NOTE — Patient Instructions (Signed)
Eric Ford,  It was a pleasure taking care of your the clinic today.  I am very glad to see you doing well and also staying away from cigarettes and alcohol.  As we discussed in the room, your HIV medication is very different from the cholesterol medication.  The colonoscopy that you recently had showed some precancerous polyps and they would like to repeat colonoscopy in 1 year.  Take Care!  Dr. Eileen Stanford  Please call the internal medicine center clinic if you have any questions or concerns, we may be able to help and keep you from a long and expensive emergency room wait. Our clinic and after hours phone number is [phone], the best time to call is Monday through Friday 9 am to 4 pm but there is always someone available 24/7 if you have an emergency. If you need medication refills please notify your pharmacy one week in advance and they will send [place] a request.

## 2019-02-17 NOTE — Assessment & Plan Note (Signed)
Health maintenance: -He denies cigarette use -Occasional EtOH use -Otherwise up-to-date

## 2019-02-18 NOTE — Progress Notes (Signed)
Internal Medicine Clinic Attending  Case discussed with Dr. Agyei at the time of the visit.  We reviewed the resident's history and exam and pertinent patient test results.  I agree with the assessment, diagnosis, and plan of care documented in the resident's note.  Alexander Raines, M.D., Ph.D.  

## 2019-05-13 ENCOUNTER — Encounter: Payer: Self-pay | Admitting: Gastroenterology

## 2019-05-20 ENCOUNTER — Encounter: Payer: Self-pay | Admitting: Gastroenterology

## 2019-06-04 ENCOUNTER — Other Ambulatory Visit: Payer: Self-pay

## 2019-06-04 ENCOUNTER — Ambulatory Visit (AMBULATORY_SURGERY_CENTER): Payer: Self-pay

## 2019-06-04 VITALS — Temp 96.9°F | Ht 66.0 in | Wt 160.0 lb

## 2019-06-04 DIAGNOSIS — Z86010 Personal history of colonic polyps: Secondary | ICD-10-CM

## 2019-06-04 MED ORDER — NA SULFATE-K SULFATE-MG SULF 17.5-3.13-1.6 GM/177ML PO SOLN: 1.0000 | Freq: Once | ORAL | 0 refills | Status: AC

## 2019-06-04 NOTE — Progress Notes (Signed)
Denies allergies to eggs or soy products. Denies complication of anesthesia or sedation. Denies use of weight loss medication. Denies use of O2.   Emmi instructions given for colonoscopy.  Covid screening is scheduled for Tuesday 06/15/19 @ 1:50 Pm.

## 2019-06-15 ENCOUNTER — Other Ambulatory Visit: Payer: Self-pay | Admitting: Gastroenterology

## 2019-06-15 ENCOUNTER — Ambulatory Visit (INDEPENDENT_AMBULATORY_CARE_PROVIDER_SITE_OTHER): Payer: Medicare Other

## 2019-06-15 DIAGNOSIS — Z1159 Encounter for screening for other viral diseases: Secondary | ICD-10-CM

## 2019-06-16 LAB — SARS CORONAVIRUS 2 (TAT 6-24 HRS): SARS Coronavirus 2: NEGATIVE

## 2019-06-18 ENCOUNTER — Encounter: Payer: Self-pay | Admitting: Gastroenterology

## 2019-06-18 ENCOUNTER — Other Ambulatory Visit: Payer: Self-pay

## 2019-06-18 ENCOUNTER — Ambulatory Visit (AMBULATORY_SURGERY_CENTER): Payer: Medicare Other | Admitting: Gastroenterology

## 2019-06-18 VITALS — BP 102/84 | HR 66 | Temp 98.4°F | Resp 15 | Ht 66.0 in | Wt 160.0 lb

## 2019-06-18 DIAGNOSIS — D125 Benign neoplasm of sigmoid colon: Secondary | ICD-10-CM

## 2019-06-18 DIAGNOSIS — Z1211 Encounter for screening for malignant neoplasm of colon: Secondary | ICD-10-CM | POA: Diagnosis not present

## 2019-06-18 DIAGNOSIS — Z86010 Personal history of colonic polyps: Secondary | ICD-10-CM

## 2019-06-18 DIAGNOSIS — D124 Benign neoplasm of descending colon: Secondary | ICD-10-CM | POA: Diagnosis not present

## 2019-06-18 DIAGNOSIS — D122 Benign neoplasm of ascending colon: Secondary | ICD-10-CM | POA: Diagnosis not present

## 2019-06-18 MED ORDER — SODIUM CHLORIDE 0.9 % IV SOLN: 500.0000 mL | INTRAVENOUS | Status: DC

## 2019-06-18 NOTE — Progress Notes (Signed)
Temp LC V/s CW I have reviewed the patient's medical history in detail and updated the computerized patient record. 

## 2019-06-18 NOTE — Progress Notes (Signed)
Called to room to assist during endoscopic procedure.  Patient ID and intended procedure confirmed with present staff. Received instructions for my participation in the procedure from the performing physician.  

## 2019-06-18 NOTE — Progress Notes (Signed)
A and O x3. Report to RN. Tolerated MAC anesthesia well.

## 2019-06-18 NOTE — Patient Instructions (Signed)
Information on polyps and hemorrhoids given to you today.  Await pathology results.  YOU HAD AN ENDOSCOPIC PROCEDURE TODAY AT THE [facility]:   Refer to the procedure report that was given to you for any specific questions about what was found during the examination.  If the procedure report does not answer your questions, please call your gastroenterologist to clarify.  If you requested that your care partner not be given the details of your procedure findings, then the procedure report has been included in a sealed envelope for you to review at your convenience later.  YOU SHOULD EXPECT: Some feelings of bloating in the abdomen. Passage of more gas than usual.  Walking can help get rid of the air that was put into your GI tract during the procedure and reduce the bloating. If you had a lower endoscopy (such as a colonoscopy or flexible sigmoidoscopy) you may notice spotting of blood in your stool or on the toilet paper. If you underwent a bowel prep for your procedure, you may not have a normal bowel movement for a few days.  Please Note:  You might notice some irritation and congestion in your nose or some drainage.  This is from the oxygen used during your procedure.  There is no need for concern and it should clear up in a day or so.  SYMPTOMS TO REPORT IMMEDIATELY:   Following lower endoscopy (colonoscopy or flexible sigmoidoscopy):  Excessive amounts of blood in the stool  Significant tenderness or worsening of abdominal pains  Swelling of the abdomen that is new, acute  Fever of 100F or higher   For urgent or emergent issues, a gastroenterologist can be reached at any hour by calling [phone].   DIET:  We do recommend a small meal at first, but then you may proceed to your regular diet.  Drink plenty of fluids but you should avoid alcoholic beverages for 24 hours.  ACTIVITY:  You should plan to take it easy for the rest of today and you should NOT DRIVE or use  heavy machinery until tomorrow (because of the sedation medicines used during the test).    FOLLOW UP: Our staff will call the number listed on your records 48-72 hours following your procedure to check on you and address any questions or concerns that you may have regarding the information given to you following your procedure. If we do not reach you, we will leave a message.  We will attempt to reach you two times.  During this call, we will ask if you have developed any symptoms of COVID 19. If you develop any symptoms (ie: fever, flu-like symptoms, shortness of breath, cough etc.) before then, please call [phone].  If you test positive for Covid 19 in the 2 weeks post procedure, please call and report this information to us.    If any biopsies were taken you will be contacted by phone or by letter within the next 1-3 weeks.  Please call us at [phone] if you have not heard about the biopsies in 3 weeks.    SIGNATURES/CONFIDENTIALITY: You and/or your care partner have signed paperwork which will be entered into your electronic medical record.  These signatures attest to the fact that that the information above on your After Visit Summary has been reviewed and is understood.  Full responsibility of the confidentiality of this discharge information lies with you and/or your care-partner. 

## 2019-06-18 NOTE — Op Note (Signed)
[facility] Patient Name: Eric Ford Procedure Date: 06/18/2019 2:49 PM MRN: [id number] Endoscopist: Milus Banister , MD Age: 63 Referring MD:  Date of Birth: Dec 08, 1955 Gender: Male Account #: [id number] Procedure:                Colonoscopy Indications:              High risk colon cancer surveillance: Personal                            history of colonic polyps; Colonoscopy 2019 12                            subCM adenomas, he declined genetic testing Medicines:                Monitored Anesthesia Care Procedure:                Pre-Anesthesia Assessment:                           - Prior to the procedure, a History and Physical                            was performed, and patient medications and                            allergies were reviewed. The patient's tolerance of                            previous anesthesia was also reviewed. The risks                            and benefits of the procedure and the sedation                            options and risks were discussed with the patient.                            All questions were answered, and informed consent                            was obtained. Prior Anticoagulants: The patient has                            taken no previous anticoagulant or antiplatelet                            agents. ASA Grade Assessment: II - A patient with                            mild systemic disease. After reviewing the risks                            and benefits, the patient was deemed in  satisfactory condition to undergo the procedure.                           After obtaining informed consent, the colonoscope                            was passed under direct vision. Throughout the                            procedure, the patient's blood pressure, pulse, and                            oxygen saturations were monitored continuously. The                            Colonoscope was  introduced through the anus and                            advanced to the the cecum, identified by                            appendiceal orifice and ileocecal valve. The                            colonoscopy was performed without difficulty. The                            patient tolerated the procedure well. The quality                            of the bowel preparation was good. The ileocecal                            valve, appendiceal orifice, and rectum were                            photographed. Scope In: 2:56:28 PM Scope Out: 3:11:40 PM Scope Withdrawal Time: 0 hours 14 minutes 5 seconds  Total Procedure Duration: 0 hours 15 minutes 12 seconds  Findings:                 Three sessile polyps were found in the sigmoid                            colon, descending colon and ascending colon. The                            polyps were 2 to 3 mm in size. These polyps were                            removed with a cold snare. Resection and retrieval                            were complete.  External and internal hemorrhoids were found. The                            hemorrhoids were small.                           The exam was otherwise without abnormality. Complications:            No immediate complications. Estimated blood loss:                            None. Estimated Blood Loss:     Estimated blood loss: none. Impression:               - Three 2 to 3 mm polyps in the sigmoid colon, in                            the descending colon and in the ascending colon,                            removed with a cold snare. Resected and retrieved.                           - External and internal hemorrhoids.                           - The examination was otherwise normal. Recommendation:           - Patient has a contact number available for                            emergencies. The signs and symptoms of potential                            delayed  complications were discussed with the                            patient. Return to normal activities tomorrow.                            Written discharge instructions were provided to the                            patient.                           - Resume previous diet.                           - Continue present medications.                           - Await pathology results. Milus Banister, MD 06/18/2019 3:14:30 PM This report has been signed electronically.

## 2019-06-22 ENCOUNTER — Telehealth: Payer: Self-pay | Admitting: *Deleted

## 2019-06-22 NOTE — Telephone Encounter (Signed)
  Follow up Call-  Call back number 06/18/2019 05/29/2018 04/22/2018  Post procedure Call Back phone  # [phone] [phone]  Permission to leave phone message Yes Yes Yes  Some recent data might be hidden     Patient questions:  Do you have a fever, pain , or abdominal swelling? No. Pain Score  0 *  Have you tolerated food without any problems? Yes.    Have you been able to return to your normal activities? Yes.    Do you have any questions about your discharge instructions: Diet   No. Medications  No. Follow up visit  No.  Do you have questions or concerns about your Care? No.  Actions: * If pain score is 4 or above: No action needed, pain <4.

## 2019-06-24 ENCOUNTER — Encounter: Payer: Self-pay | Admitting: Gastroenterology

## 2019-08-03 ENCOUNTER — Telehealth: Payer: Self-pay

## 2019-08-03 NOTE — Telephone Encounter (Signed)
COVID-19 Pre-Screening Questions:08/03/19  Do you currently have a fever (>100 F), chills or unexplained body aches?NO  Are you currently experiencing new cough, shortness of breath, sore throat, runny nose? NO  .  Have you recently travelled outside the state of [place] in the last 14 days? NO .  Have you been in contact with someone that is currently pending confirmation of Covid19 testing or has been confirmed to have the [place] virus?   PATIENT HAD A COLONOSCOPY AND WAS TESTED FOR COVID, CAME BACK NEGATIVE.

## 2019-08-04 ENCOUNTER — Other Ambulatory Visit: Payer: Self-pay

## 2019-08-04 ENCOUNTER — Other Ambulatory Visit: Payer: Medicare Other

## 2019-08-04 DIAGNOSIS — E785 Hyperlipidemia, unspecified: Secondary | ICD-10-CM | POA: Diagnosis not present

## 2019-08-04 DIAGNOSIS — B20 Human immunodeficiency virus [HIV] disease: Secondary | ICD-10-CM

## 2019-08-04 DIAGNOSIS — Z113 Encounter for screening for infections with a predominantly sexual mode of transmission: Secondary | ICD-10-CM

## 2019-08-05 LAB — T-HELPER CELL (CD4) - (RCID CLINIC ONLY)
CD4 % Helper T Cell: 25 % — ABNORMAL LOW (ref 33–65)
CD4 T Cell Abs: 622 /uL (ref 400–1790)

## 2019-08-08 LAB — CBC WITH DIFFERENTIAL/PLATELET
Absolute Monocytes: 595 cells/uL (ref 200–950)
Basophils Absolute: 63 cells/uL (ref 0–200)
Basophils Relative: 0.9 %
Eosinophils Absolute: 91 cells/uL (ref 15–500)
Eosinophils Relative: 1.3 %
HCT: 43.1 % (ref 38.5–50.0)
Hemoglobin: 13.7 g/dL (ref 13.2–17.1)
Lymphs Abs: 2394 cells/uL (ref 850–3900)
MCH: 25.7 pg — ABNORMAL LOW (ref 27.0–33.0)
MCHC: 31.8 g/dL — ABNORMAL LOW (ref 32.0–36.0)
MCV: 80.7 fL (ref 80.0–100.0)
MPV: 11.2 fL (ref 7.5–12.5)
Monocytes Relative: 8.5 %
Neutro Abs: 3857 cells/uL (ref 1500–7800)
Neutrophils Relative %: 55.1 %
Platelets: 250 10*3/uL (ref 140–400)
RBC: 5.34 10*6/uL (ref 4.20–5.80)
RDW: 13 % (ref 11.0–15.0)
Total Lymphocyte: 34.2 %
WBC: 7 10*3/uL (ref 3.8–10.8)

## 2019-08-08 LAB — COMPLETE METABOLIC PANEL WITH GFR
AG Ratio: 1.1 (calc) (ref 1.0–2.5)
ALT: 32 U/L (ref 9–46)
AST: 36 U/L — ABNORMAL HIGH (ref 10–35)
Albumin: 4.1 g/dL (ref 3.6–5.1)
Alkaline phosphatase (APISO): 77 U/L (ref 35–144)
BUN: 20 mg/dL (ref 7–25)
CO2: 19 mmol/L — ABNORMAL LOW (ref 20–32)
Calcium: 9 mg/dL (ref 8.6–10.3)
Chloride: 100 mmol/L (ref 98–110)
Creat: 1.24 mg/dL (ref 0.70–1.25)
GFR, Est African American: 70 mL/min/{1.73_m2} (ref >=60)
GFR, Est Non African American: 61 mL/min/{1.73_m2} (ref >=60)
Globulin: 3.9 g/dL (calc) — ABNORMAL HIGH (ref 1.9–3.7)
Glucose, Bld: 326 mg/dL — ABNORMAL HIGH (ref 65–99)
Potassium: 4.3 mmol/L (ref 3.5–5.3)
Sodium: 131 mmol/L — ABNORMAL LOW (ref 135–146)
Total Bilirubin: 0.5 mg/dL (ref 0.2–1.2)
Total Protein: 8 g/dL (ref 6.1–8.1)

## 2019-08-08 LAB — LIPID PANEL
Cholesterol: 137 mg/dL (ref <200)
HDL: 14 mg/dL — ABNORMAL LOW (ref >=40)
LDL Cholesterol (Calc): 85 mg/dL (calc)
Non-HDL Cholesterol (Calc): 123 mg/dL (calc) (ref <130)
Total CHOL/HDL Ratio: 9.8 (calc) — ABNORMAL HIGH (ref <5.0)
Triglycerides: 289 mg/dL — ABNORMAL HIGH (ref <150)

## 2019-08-08 LAB — HIV-1 RNA QUANT-NO REFLEX-BLD
HIV 1 RNA Quant: <20 copies/mL — AB
HIV-1 RNA Quant, Log: <1.3 Log copies/mL — AB

## 2019-08-08 LAB — RPR: RPR Ser Ql: NONREACTIVE

## 2019-08-18 ENCOUNTER — Encounter: Payer: Medicare Other | Admitting: Internal Medicine

## 2019-08-18 ENCOUNTER — Encounter: Payer: Self-pay | Admitting: Internal Medicine

## 2019-08-18 ENCOUNTER — Ambulatory Visit (INDEPENDENT_AMBULATORY_CARE_PROVIDER_SITE_OTHER): Payer: Medicare Other | Admitting: Internal Medicine

## 2019-08-18 ENCOUNTER — Other Ambulatory Visit: Payer: Self-pay

## 2019-08-18 DIAGNOSIS — Z23 Encounter for immunization: Secondary | ICD-10-CM | POA: Diagnosis not present

## 2019-08-18 DIAGNOSIS — I10 Essential (primary) hypertension: Secondary | ICD-10-CM | POA: Diagnosis not present

## 2019-08-18 DIAGNOSIS — N529 Male erectile dysfunction, unspecified: Secondary | ICD-10-CM

## 2019-08-18 DIAGNOSIS — Z0000 Encounter for general adult medical examination without abnormal findings: Secondary | ICD-10-CM

## 2019-08-18 DIAGNOSIS — Z79899 Other long term (current) drug therapy: Secondary | ICD-10-CM

## 2019-08-18 MED ORDER — SILDENAFIL CITRATE 50 MG PO TABS: 50.0000 mg | ORAL_TABLET | ORAL | 1 refills | Status: DC | PRN

## 2019-08-18 NOTE — Assessment & Plan Note (Signed)
#  Health maintenance: Flu vaccine today

## 2019-08-18 NOTE — Patient Instructions (Signed)
Eric Ford,   It was a pleasure taking care of you here in the clinic today.  Overall, you are doing very well and not depressed about it.  Your blood pressure was initially elevated however the repeat was fine.  Please continue exercising and taking care of yourself.  I have also given you a prescription for sildenafil.  If you have any issues getting this medication, please call me.  Take Care! Dr. Eileen Stanford  Please call the internal medicine center clinic if you have any questions or concerns, we may be able to help and keep you from a long and expensive emergency room wait. Our clinic and after hours phone number is [phone], the best time to call is Monday through Friday 9 am to 4 pm but there is always someone available 24/7 if you have an emergency. If you need medication refills please notify your pharmacy one week in advance and they will send [place] a request.

## 2019-08-18 NOTE — Progress Notes (Signed)
   CC: Follow-up hypertension, erectile dysfunction  HPI:  [name] is a 64 y.o. very pleasant African-American gentleman with medical history listed below presenting to follow-up on chronic medical problems.  Please see problem based charting for further details.   Past Medical History:  Diagnosis Date  . ALCOHOL ABUSE, HX OF 06/17/2006   Annotation: Cut Down 2/99 Quit 3/04 Qualifier: Diagnosis of  By: Kayleen Memos MD, Gerald Stabs    . Blood transfusion without reported diagnosis 1980  . Depression   . ERECTILE DYSFUNCTION 06/17/2006   Qualifier: Diagnosis of  By: Kayleen Memos MD, Gerald Stabs    . Family history of colon cancer   . HIV infection ([place])   . Hyperglycemia 11/17/2018  . Personal history of colonic polyps   . Personal history of colonic polyps   . Substance abuse ([place])   . Underweight 11/15/2013  . Xerosis of skin 08/09/2015   Review of Systems:  As per HPI  Physical Exam:  Vitals:   08/18/19 1510 08/18/19 1526  BP: (!) 165/96 137/80  Pulse: 97 67  SpO2: 99%   Weight: 155 lb 4.8 oz (70.4 kg)    Physical Exam  Constitutional: He is well-developed, well-nourished, and in no distress.  HENT:  Head: Normocephalic and atraumatic.  Cardiovascular: Normal rate, regular rhythm and normal heart sounds. Exam reveals no friction rub.  No murmur heard. Pulmonary/Chest: Effort normal and breath sounds normal. He has no wheezes.  Genitourinary:    Testes/scrotum and penis normal.  He exhibits no abnormal testicular mass, no testicular tenderness, no abnormal scrotal mass, no scrotal tenderness and no epididymal tenderness. Penis exhibits no lesions and no edema. No discharge found.    Assessment & Plan:   See Encounters Tab for problem based charting.  Patient discussed with Dr. Evette Doffing

## 2019-08-18 NOTE — Assessment & Plan Note (Signed)
#  Erectile dysfunction: He was previously on Viagra however per chart review he seems to have been discontinued in 2015.  He tells me that for about a year now he has been in a relationship and has trouble initiating and maintaining erection.  He also denies morning erection.  He does have history of hypertension and peripheral arterial disease which could very well explain the etiology of his erectile dysfunction.  On physical exams, he does not have gynecomastia or any evidence of low testosterone state.  On examinations of his genitals, his testicles are normal in size and there does not appear to be any lesions in his genital area.  Plan: -Resume sildenafil

## 2019-08-18 NOTE — Assessment & Plan Note (Signed)
#  Hypertension: Initially elevated however repeat was unremarkable.  Previously on hydrochlorothiazide which was discontinued due to hypotension.  BP Readings from Last 3 Encounters:  08/18/19 137/80  06/18/19 102/84  02/17/19 129/76   Plan: -Continue to monitor

## 2019-08-19 ENCOUNTER — Telehealth: Payer: Self-pay | Admitting: Internal Medicine

## 2019-08-19 NOTE — Progress Notes (Signed)
Internal Medicine Clinic Attending  Case discussed with Dr. Agyei at the time of the visit.  We reviewed the resident's history and exam and pertinent patient test results.  I agree with the assessment, diagnosis, and plan of care documented in the resident's note.    

## 2019-08-19 NOTE — Telephone Encounter (Signed)
Will have MD change rx to sildenafil 20mg  tabs and adjust dose accordingly.  Medication still not covered by insurance, but pt will be given Good Rx card along with hard copy of rx to take to pharmacy below.  Pt asked that info be mailed to him-address was confirmed.   Price per Good Rx  Sildenafil 20mg  #30 [facility] $12.17 [facility] $20.25 [facility] $17.26

## 2019-08-19 NOTE — Telephone Encounter (Signed)
Pt needs viagra medicine change to another one, insurance denied that one; pls contact pt [phone]

## 2019-08-21 ENCOUNTER — Other Ambulatory Visit: Payer: Self-pay | Admitting: Internal Medicine

## 2019-08-21 DIAGNOSIS — N529 Male erectile dysfunction, unspecified: Secondary | ICD-10-CM

## 2019-08-21 MED ORDER — SILDENAFIL CITRATE 50 MG PO TABS: 50.0000 mg | ORAL_TABLET | ORAL | 1 refills | Status: DC | PRN

## 2019-08-21 NOTE — Telephone Encounter (Signed)
Hi Kaye,  I'll print a prescription and put it in my box.   Thanks

## 2019-08-25 ENCOUNTER — Other Ambulatory Visit: Payer: Self-pay | Admitting: Internal Medicine

## 2019-08-25 DIAGNOSIS — N529 Male erectile dysfunction, unspecified: Secondary | ICD-10-CM

## 2019-08-25 MED ORDER — SILDENAFIL CITRATE 20 MG PO TABS: 20.0000 mg | ORAL_TABLET | Freq: Every day | ORAL | 0 refills | Status: AC | PRN

## 2019-08-25 MED ORDER — SILDENAFIL CITRATE 20 MG PO TABS: 20.0000 mg | ORAL_TABLET | Freq: Every day | ORAL | 0 refills | Status: DC

## 2019-08-26 NOTE — Telephone Encounter (Signed)
Rx for sildenafil 50mg  changed to sildenafil 20mg  (due to cost).  [facility] mailed rx and good rx coupons to patient.Marland KitchenMarland KitchenDespina Hidden Cassady1/21/20214:13 PM

## 2019-09-01 ENCOUNTER — Telehealth: Payer: Self-pay | Admitting: Internal Medicine

## 2019-09-01 NOTE — Telephone Encounter (Signed)
Thanks

## 2019-09-01 NOTE — Telephone Encounter (Signed)
Patient notified that Rx for sildenafil and coupon for GoodRx was mailed out on 08/26/2019. He will call back in a couple of days if he has not received them by then. [name], BSN, RN-BC

## 2019-09-01 NOTE — Telephone Encounter (Signed)
Pt is calling checking on medicine that was suppose to be mail out [phone]

## 2019-09-08 ENCOUNTER — Other Ambulatory Visit: Payer: Self-pay

## 2019-09-08 ENCOUNTER — Ambulatory Visit (INDEPENDENT_AMBULATORY_CARE_PROVIDER_SITE_OTHER): Payer: Medicare Other | Admitting: Internal Medicine

## 2019-09-08 ENCOUNTER — Encounter: Payer: Self-pay | Admitting: Internal Medicine

## 2019-09-08 VITALS — BP 170/88 | HR 60 | Temp 97.5°F | Ht 66.0 in | Wt 152.0 lb

## 2019-09-08 DIAGNOSIS — B20 Human immunodeficiency virus [HIV] disease: Secondary | ICD-10-CM | POA: Diagnosis not present

## 2019-09-08 DIAGNOSIS — Z23 Encounter for immunization: Secondary | ICD-10-CM | POA: Diagnosis not present

## 2019-09-08 DIAGNOSIS — E785 Hyperlipidemia, unspecified: Secondary | ICD-10-CM | POA: Diagnosis not present

## 2019-09-08 DIAGNOSIS — Z113 Encounter for screening for infections with a predominantly sexual mode of transmission: Secondary | ICD-10-CM | POA: Diagnosis not present

## 2019-09-08 NOTE — Progress Notes (Signed)
   Subjective:    Patient ID: Eric Ford, male    DOB: [birth year], 64 y.o.   MRN: [id number]  HPI Here for follow up of HIV He continues on Biktarvy and no missed doses. CD4 of 622, viral load < 20.  No new complaints.  No associated n/v/d.     Review of Systems  Constitutional: Negative for unexpected weight change.  Gastrointestinal: Negative for diarrhea and nausea.  Skin: Negative for rash.       Objective:   Physical Exam Constitutional:      Appearance: Normal appearance.  Eyes:     General: No scleral icterus. Pulmonary:     Effort: Pulmonary effort is normal.  Neurological:     Mental Status: He is alert.  Psychiatric:        Mood and Affect: Mood normal.   SH: former smoker        Assessment & Plan:

## 2019-09-08 NOTE — Patient Instructions (Signed)
COVID-19 Vaccine Information can be found at: [email or web address] For questions related to vaccine distribution or appointments, please email [email or web address] or call [phone].    

## 2019-09-08 NOTE — Assessment & Plan Note (Signed)
Screened negative 

## 2019-09-08 NOTE — Assessment & Plan Note (Signed)
Lipid panel checked and stable on atorvastatin

## 2019-09-08 NOTE — Assessment & Plan Note (Addendum)
He is doing well, no changes and rtc 6 months.  Info given for the COVID vaccine

## 2019-09-08 NOTE — Addendum Note (Signed)
Addended by: Elta Guadeloupe on: 09/08/2019 10:35 AM   Modules accepted: Orders

## 2019-11-06 ENCOUNTER — Ambulatory Visit: Payer: Medicare Other | Attending: Internal Medicine

## 2019-11-06 DIAGNOSIS — Z23 Encounter for immunization: Secondary | ICD-10-CM

## 2019-11-06 NOTE — Progress Notes (Signed)
   [facility]ov[facility] Vaccination [facility]  Name:  Eric Ford    MRN: [id number] DOB: 26-Apr-1956  11/06/2019  Mr. Eric Ford was observed post Covid-19 immunization for 30 minutes based on pre-vaccination screening without incident. He was provided with Vaccine Information Sheet and instruction to access the V-Safe system.   Mr. Eric Ford was instructed to call 911 with any severe reactions post vaccine: Marland Kitchen Difficulty breathing  . Swelling of face and throat  . A fast heartbeat  . A bad rash all over body  . Dizziness and weakness   Immunizations Administered    Name Date Dose VIS Date Route   Pfizer COVID-19 Vaccine 11/06/2019  3:48 PM 0.3 mL 07/16/2019 Intramuscular   Manufacturer: [place]   Lot: [id number]   [place]: [id number]

## 2019-11-10 ENCOUNTER — Other Ambulatory Visit: Payer: Self-pay | Admitting: Internal Medicine

## 2019-11-30 ENCOUNTER — Ambulatory Visit: Payer: Medicare Other

## 2019-12-04 DEATH — deceased

## 2020-03-07 ENCOUNTER — Other Ambulatory Visit: Payer: Medicare Other

## 2020-03-22 ENCOUNTER — Encounter: Payer: Self-pay | Admitting: Internal Medicine
